# Patient Record
Sex: Female | Born: 1975 | Race: White | Hispanic: No | Marital: Married | Smoking: Never smoker
Health system: Southern US, Community
[De-identification: ages and names within clinical notes are randomized; demographics above are authoritative.]

## PROBLEM LIST (undated history)

## (undated) DIAGNOSIS — G43909 Migraine, unspecified, not intractable, without status migrainosus: Secondary | ICD-10-CM

## (undated) DIAGNOSIS — T4145XA Adverse effect of unspecified anesthetic, initial encounter: Secondary | ICD-10-CM

## (undated) DIAGNOSIS — D649 Anemia, unspecified: Secondary | ICD-10-CM

## (undated) DIAGNOSIS — R011 Cardiac murmur, unspecified: Secondary | ICD-10-CM

## (undated) DIAGNOSIS — F419 Anxiety disorder, unspecified: Secondary | ICD-10-CM

## (undated) DIAGNOSIS — T8859XA Other complications of anesthesia, initial encounter: Secondary | ICD-10-CM

## (undated) DIAGNOSIS — R112 Nausea with vomiting, unspecified: Secondary | ICD-10-CM

## (undated) DIAGNOSIS — Z9889 Other specified postprocedural states: Secondary | ICD-10-CM

## (undated) DIAGNOSIS — K219 Gastro-esophageal reflux disease without esophagitis: Secondary | ICD-10-CM

## (undated) HISTORY — PX: ABDOMINAL HYSTERECTOMY: SHX81

## (undated) HISTORY — DX: Migraine, unspecified, not intractable, without status migrainosus: G43.909

## (undated) HISTORY — PX: BREAST ENHANCEMENT SURGERY: SHX7

## (undated) HISTORY — PX: WISDOM TOOTH EXTRACTION: SHX21

---

## 2004-01-02 HISTORY — PX: LASIK: SHX215

## 2006-01-01 HISTORY — PX: AUGMENTATION MAMMAPLASTY: SUR837

## 2006-01-01 HISTORY — PX: BREAST REDUCTION SURGERY: SHX8

## 2006-09-04 ENCOUNTER — Ambulatory Visit: Payer: Self-pay | Admitting: Plastic Surgery

## 2009-10-20 ENCOUNTER — Ambulatory Visit: Payer: Self-pay | Admitting: Neurology

## 2012-12-10 ENCOUNTER — Emergency Department: Payer: Self-pay | Admitting: Internal Medicine

## 2012-12-10 LAB — COMPREHENSIVE METABOLIC PANEL
Alkaline Phosphatase: 58 U/L
BUN: 13 mg/dL (ref 7–18)
Calcium, Total: 9 mg/dL (ref 8.5–10.1)
Chloride: 107 mmol/L (ref 98–107)
Co2: 25 mmol/L (ref 21–32)
EGFR (African American): >60
EGFR (Non-African Amer.): >60
Glucose: 138 mg/dL — ABNORMAL HIGH (ref 65–99)
Osmolality: 278 (ref 275–301)
Potassium: 3.8 mmol/L (ref 3.5–5.1)
Sodium: 138 mmol/L (ref 136–145)

## 2012-12-10 LAB — CBC
HCT: 41.4 % (ref 35.0–47.0)
HGB: 14.2 g/dL (ref 12.0–16.0)
MCH: 30.6 pg (ref 26.0–34.0)
RBC: 4.63 10*6/uL (ref 3.80–5.20)
RDW: 12.9 % (ref 11.5–14.5)

## 2012-12-10 LAB — CK TOTAL AND CKMB (NOT AT ARMC): CK-MB: <0.5 ng/mL — ABNORMAL LOW (ref 0.5–3.6)

## 2012-12-10 LAB — URINALYSIS, COMPLETE
Bilirubin,UR: NEGATIVE
Blood: NEGATIVE
Glucose,UR: NEGATIVE mg/dL (ref 0–75)
Ketone: NEGATIVE
Nitrite: NEGATIVE
Ph: 6 (ref 4.5–8.0)
Protein: NEGATIVE

## 2012-12-10 LAB — DRUG SCREEN, URINE
Amphetamines, Ur Screen: NEGATIVE (ref ?–1000)
Cannabinoid 50 Ng, Ur ~~LOC~~: NEGATIVE (ref ?–50)
Cocaine Metabolite,Ur ~~LOC~~: NEGATIVE (ref ?–300)
Methadone, Ur Screen: NEGATIVE (ref ?–300)

## 2013-10-13 ENCOUNTER — Encounter: Payer: Self-pay | Admitting: *Deleted

## 2013-10-21 ENCOUNTER — Ambulatory Visit (INDEPENDENT_AMBULATORY_CARE_PROVIDER_SITE_OTHER): Payer: 59 | Admitting: General Surgery

## 2013-10-21 ENCOUNTER — Encounter: Payer: Self-pay | Admitting: General Surgery

## 2013-10-21 ENCOUNTER — Other Ambulatory Visit: Payer: 59

## 2013-10-21 VITALS — BP 134/78 | HR 82 | Resp 12 | Ht 65.0 in | Wt 155.0 lb

## 2013-10-21 DIAGNOSIS — D242 Benign neoplasm of left breast: Secondary | ICD-10-CM

## 2013-10-21 NOTE — Progress Notes (Signed)
Patient ID: Judy Sims, female   DOB: 05/31/1975, 38 y.o.   MRN: [id number]  Chief Complaint  Patient presents with  . Other    breast check    HPI Judy Sims is a 38 y.o. female. here today for a breast evaluation. She states back in May 2015 she went to the doctor and they said it was some yeast , they give her some cream and it got better. Last week she saw Dr. Laurey Morale  Patient states there is a patch of dry skin over her left nipple and areola. .She  noticed this on 10/13/13. She states the under her left breast it is very itchy.    HPI  Past Medical History  Diagnosis Date  . Migraine     Past Surgical History  Procedure Laterality Date  . Breast reduction surgery  2008  . Breast enhancement surgery Left 2008    Family History  Problem Relation Age of Onset  . Colon cancer Maternal Grandfather     Social History History  Substance Use Topics  . Smoking status: Never Smoker   . Smokeless tobacco: Never Used  . Alcohol Use: Yes    Allergies  Allergen Reactions  . Sulpho-Lac Medicated Soap [Sulfur] Hives    Current Outpatient Prescriptions  Medication Sig Dispense Refill  . Multiple Vitamin (MULTI VITAMIN DAILY) TABS Take 1 tablet by mouth daily.       No current facility-administered medications for this visit.    Review of Systems Review of Systems  Constitutional: Negative.   Respiratory: Negative.   Cardiovascular: Negative.     Blood pressure 134/78, pulse 82, resp. rate 12, height 5\' 5"  (1.651 m), weight 155 lb (70.308 kg), last menstrual period 10/02/2013.  Physical Exam Physical Exam  Constitutional: She is oriented to person, place, and time. She appears well-developed and well-nourished.  Eyes: Conjunctivae are normal. No scleral icterus.  Neck: Neck supple. No thyromegaly present.  Cardiovascular: Normal rate, regular rhythm and normal heart sounds.   Pulmonary/Chest: Effort normal and breath sounds normal. Right breast exhibits no  inverted nipple, no mass, no nipple discharge, no skin change and no tenderness. Left breast exhibits no inverted nipple, no mass, no nipple discharge, no skin change and no tenderness.  Mild scaling of the left nipple but no skin lesion or discharge present.  Bilateral implants are present, no apprent deformity./   Lymphadenopathy:    She has no cervical adenopathy.    She has no axillary adenopathy.  Neurological: She is alert and oriented to person, place, and time.  Skin: Skin is warm and dry.    Data Reviewed Notes reviewed [place] of left nipple area was performed-no abnormality seen. Assessment    Mild peeling of skin left nipple-no skin break , scab or lesions noted.    Plan           Judy Sims 10/21/2013, 1:57 PM

## 2013-10-21 NOTE — Patient Instructions (Addendum)
Patient to return in three months. Call if she notes any discharge, new skin changes.

## 2013-11-03 ENCOUNTER — Encounter: Payer: Self-pay | Admitting: General Surgery

## 2014-01-27 ENCOUNTER — Ambulatory Visit: Payer: 59

## 2014-01-27 ENCOUNTER — Encounter: Payer: Self-pay | Admitting: General Surgery

## 2014-01-27 ENCOUNTER — Ambulatory Visit (INDEPENDENT_AMBULATORY_CARE_PROVIDER_SITE_OTHER): Payer: 59 | Admitting: General Surgery

## 2014-01-27 VITALS — BP 112/74 | HR 72 | Resp 14 | Ht 65.0 in | Wt 156.0 lb

## 2014-01-27 DIAGNOSIS — N6320 Unspecified lump in the left breast, unspecified quadrant: Secondary | ICD-10-CM

## 2014-01-27 DIAGNOSIS — N6489 Other specified disorders of breast: Secondary | ICD-10-CM

## 2014-01-27 DIAGNOSIS — N63 Unspecified lump in breast: Secondary | ICD-10-CM

## 2014-01-27 NOTE — Progress Notes (Signed)
Patient ID: Judy Sims, female   DOB: 07-02-1975, 39 y.o.   MRN: [id number]  Chief Complaint  Patient presents with  . Breast Problem    left nipple changes    HPI Judy Sims is a 39 y.o. female.  Here today for evaluation of left nipple changes.  She was here in October with crusting of the left nipple. She has been using coconut oil and lotion and it seems to help some. Check possible mass under left nipple.   HPI  Past Medical History  Diagnosis Date  . Migraine     Past Surgical History  Procedure Laterality Date  . Breast reduction surgery  2008  . Breast enhancement surgery Left 2008    Family History  Problem Relation Age of Onset  . Colon cancer Maternal Grandfather     Social History History  Substance Use Topics  . Smoking status: Never Smoker   . Smokeless tobacco: Never Used  . Alcohol Use: Yes    Allergies  Allergen Reactions  . Sulpho-Lac Medicated Soap [Sulfur] Hives    Current Outpatient Prescriptions  Medication Sig Dispense Refill  . Multiple Vitamin (MULTI VITAMIN DAILY) TABS Take 1 tablet by mouth daily.     No current facility-administered medications for this visit.    Review of Systems Review of Systems  Constitutional: Negative.   Respiratory: Negative.   Cardiovascular: Negative.     Blood pressure 112/74, pulse 72, resp. rate 14, height 5\' 5"  (1.651 m), weight 156 lb (70.761 kg), last menstrual period 01/26/2014.  Physical Exam Physical Exam  Constitutional: She is oriented to person, place, and time. She appears well-developed and well-nourished.  Pulmonary/Chest: Right breast exhibits no inverted nipple, no mass, no nipple discharge, no skin change and no tenderness. Left breast exhibits skin change. Left breast exhibits no inverted nipple, no mass, no nipple discharge and no tenderness.  Minimal scaling around left nipple, no open areas, no scabs or discharge. Bilateral implants are present, no apprent deformity.   Lymphadenopathy:    She has no axillary adenopathy.  Neurological: She is alert and oriented to person, place, and time.  Skin: Skin is warm and dry.    Data Reviewed Notes reviewed. [place] of left nipple area was performed again today- no findings.  Assessment    Mild scaliness of skin left nipple-no skin break down, scab or lesions noted. It does appear to be improved.      Plan   Pt reassured. Will follow on as needed basis. Advised to call if she notes any worsening. Follow up as needed. Continue self breast exams. Call office for any new breast issues or concerns.       SANKAR,SEEPLAPUTHUR G 01/27/2014, 2:17 PM

## 2014-01-27 NOTE — Patient Instructions (Signed)
Continue self breast exams. Call office for any new breast issues or concerns. 

## 2014-12-06 ENCOUNTER — Ambulatory Visit (INDEPENDENT_AMBULATORY_CARE_PROVIDER_SITE_OTHER): Payer: 59 | Admitting: Physician Assistant

## 2014-12-06 ENCOUNTER — Encounter: Payer: Self-pay | Admitting: Physician Assistant

## 2014-12-06 VITALS — BP 100/70 | HR 78 | Temp 98.3°F | Resp 16 | Ht 65.0 in | Wt 157.6 lb

## 2014-12-06 DIAGNOSIS — F419 Anxiety disorder, unspecified: Secondary | ICD-10-CM | POA: Diagnosis not present

## 2014-12-06 DIAGNOSIS — G43909 Migraine, unspecified, not intractable, without status migrainosus: Secondary | ICD-10-CM | POA: Insufficient documentation

## 2014-12-06 MED ORDER — ALPRAZOLAM 0.5 MG PO TABS: ORAL_TABLET | ORAL | Status: DC

## 2014-12-06 NOTE — Progress Notes (Signed)
Patient: Judy Sims Female    DOB: 1975/06/14   39 y.o.   MRN: [id number] Visit Date: 12/06/2014  Today's Provider: Mar Daring, PA-C   Chief Complaint  Patient presents with  . Establish Care   Subjective:    Anxiety Presents for initial (Patient is new to the practice and wants to talk about feeling anxious) visit. Onset was 1 to 6 months ago. The problem has been gradually worsening. Symptoms include decreased concentration, excessive worry, insomnia, irritability, nervous/anxious behavior and palpitations (one time only). Patient reports no chest pain, dizziness, feeling of choking, panic or suicidal ideas. Symptoms occur occasionally. The most recent episode lasted 30 minutes (sometimes depends on the situation). The severity of symptoms is moderate, interfering with daily activities and causing significant distress (not everyitme). Exacerbated by: More home with the remodeling. The quality of sleep is fair. Nighttime awakenings: several.   Treatments tried: Patient was prescribed Lexapro but did not take  the medicine.  She had been previously seen at [place] for her annual physical where she discuss this with her provider there. That was where she was placed on Lexapro. She states she did not want to take it as she does not feel she needs an everyday medication. She feels she just needs something as needed. 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 She states she has dealt with this all her life and has normally done well with controlling symptoms but with the issues going on with their house and all the remodeling going on this has made it more difficult for her to find time to relax and to unwind. Not having this relaxation time has increased her anxiety and irritability. She also has decreased sleep. She states all of this started since October. She  states that when she lies down she has racing thoughts and worrying thoughts that keep her awake and making it difficult for her to fall asleep.     Allergies  Allergen Reactions  . Sulpho-Lac Medicated Soap [Sulfur] Hives   Previous Medications   MULTIPLE VITAMIN (MULTI VITAMIN DAILY) TABS    Take 1 tablet by mouth daily.    Review of Systems  Constitutional: Positive for irritability and fatigue.  Eyes: Negative.   Respiratory: Negative.   Cardiovascular: Positive for palpitations (one time only). Negative for chest pain and leg swelling.  Gastrointestinal: Negative.   Endocrine: Negative.   Genitourinary: Negative.   Musculoskeletal: Negative.   Skin: Negative.   Allergic/Immunologic: Negative.   Neurological: Positive for headaches. Negative for dizziness, weakness, light-headedness and numbness.  Hematological: Negative.   Psychiatric/Behavioral: Positive for sleep disturbance, decreased concentration and agitation. Negative for suicidal ideas, self-injury and dysphoric mood. The patient is nervous/anxious and has insomnia. The patient is not hyperactive.     Social History  Substance Use Topics  . Smoking status: Never Smoker   . Smokeless tobacco: Never Used  . Alcohol Use: Yes   Objective:   BP 100/70 mmHg  Pulse 78  Temp(Src) 98.3 F (36.8 C) (Oral)  Resp 16  Ht 5\' 5"  (1.651 m)  Wt 157 lb 9.6 oz (71.487 kg)  BMI 26.23 kg/m2  LMP 11/09/2014  Physical Exam  Constitutional: She is oriented to person, place, and time. She appears well-developed and well-nourished. No distress.  Cardiovascular: Normal rate, regular rhythm and normal heart  sounds.  Exam reveals no gallop and no friction rub.   No murmur heard. Pulmonary/Chest: Effort normal and breath sounds normal. No respiratory distress. She has no wheezes. She has no rales.  Neurological: She is alert and oriented to person, place, and time.  Skin: She is not diaphoretic.  Psychiatric: Her speech is normal  and behavior is normal. Judgment and thought content normal. Her mood appears anxious. Cognition and memory are normal.  Vitals reviewed.       Assessment & Plan:     1. Acute anxiety Increasing and worsening anxiety since October 2016. I will treat with Xanax as below. I also advised her to increase physical activity as this will help with anxiety as well as sleep. We also discussed sleep hygiene and sleep meditation techniques. She is to try the sleeping techniques as well as a half a tablet of Xanax nightly as needed to see if this helps her sleep. She may also take another half a tablet to one full tablet every 8 hours as needed during the day for anxiety attacks. I will follow-up with her in 4 weeks to see how she is doing with this treatment plan. If she is taking an increased dose of the Xanax daily we will add an antidepressant to see if it will help decrease anxiety. We will also check labs including TSH if she is not improving. She is to call the office if symptoms fail to improve or worsen in the meantime. - ALPRAZolam (XANAX) 0.5 MG tablet; Take 1/2 to 1 tablet PO q h.s. And then may take 1/2 to 1 tab PO q 8 hrs prn anxiety  Dispense: 45 tablet; Refill: [place], PA-C  [facility]

## 2014-12-06 NOTE — Patient Instructions (Addendum)
Generalized Anxiety Disorder Generalized anxiety disorder (GAD) is a mental disorder. It interferes with life functions, including relationships, work, and school. GAD is different from normal anxiety, which everyone experiences at some point in their lives in response to specific life events and activities. Normal anxiety actually helps [place] prepare for and get through these life events and activities. Normal anxiety goes away after the event or activity is over.  GAD causes anxiety that is not necessarily related to specific events or activities. It also causes excess anxiety in proportion to specific events or activities. The anxiety associated with GAD is also difficult to control. GAD can vary from mild to severe. People with severe GAD can have intense waves of anxiety with physical symptoms (panic attacks).  SYMPTOMS The anxiety and worry associated with GAD are difficult to control. This anxiety and worry are related to many life events and activities and also occur more days than not for 6 months or longer. People with GAD also have three or more of the following symptoms (one or more in children):  Restlessness.   Fatigue.  Difficulty concentrating.   Irritability.  Muscle tension.  Difficulty sleeping or unsatisfying sleep. DIAGNOSIS GAD is diagnosed through an assessment by your health care provider. Your health care provider will ask you questions aboutyour mood,physical symptoms, and events in your life. Your health care provider may ask you about your medical history and use of alcohol or drugs, including prescription medicines. Your health care provider may also do a physical exam and blood tests. Certain medical conditions and the use of certain substances can cause symptoms similar to those associated with GAD. Your health care provider may refer you to a mental health specialist for further evaluation. TREATMENT The following therapies are usually used to treat GAD:    Medication. Antidepressant medication usually is prescribed for long-term daily control. Antianxiety medicines may be added in severe cases, especially when panic attacks occur.   Talk therapy (psychotherapy). Certain types of talk therapy can be helpful in treating GAD by providing support, education, and guidance. A form of talk therapy called cognitive behavioral therapy can teach you healthy ways to think about and react to daily life events and activities.  Stress managementtechniques. These include yoga, meditation, and exercise and can be very helpful when they are practiced regularly. A mental health specialist can help determine which treatment is best for you. Some people see improvement with one therapy. However, other people require a combination of therapies.   This information is not intended to replace advice given to you by your health care provider. Make sure you discuss any questions you have with your health care provider.   Document Released: 04/14/2012 Document Revised: 01/08/2014 Document Reviewed: 04/14/2012 Elsevier Interactive Patient Education 2016 Elsevier Inc.  Alprazolam orally disintegrating tablet What is this medicine? ALPRAZOLAM (al PRAY zoe lam) is a benzodiazepine. It is used to treat anxiety and panic attacks. This medicine may be used for other purposes; ask your health care provider or pharmacist if you have questions. What should I tell my health care provider before I take this medicine? They need to know if you have any of these conditions: -an alcohol or drug abuse problem -bipolar disorder, depression, psychosis or other mental health conditions -glaucoma -kidney or liver disease -lung or breathing disease -myasthenia gravis -Parkinson's disease -porphyria -seizures or a history of seizures -suicidal thoughts -an unusual or allergic reaction to alprazolam, other benzodiazepines, foods, dyes, or preservatives -pregnant or trying to get  pregnant -breast-feeding How should I use this medicine? These tablets are made to dissolve in the mouth. Just before taking the tablet, remove the tablet from the bottle with dry hands. Place the tablet on the top of the tongue and allow it to dissolve, then swallow. You may take these tablets with water, but it is not necessary to do so. If only one-half of the tablet is used, the unused portion of the tablet should be safely discarded because it may not remain stable. Do not reuse this portion of the tablet. Discard unused tablets in a safe manner away from children and pets. Talk to your pediatrician regarding the use of this medicine in children. Special care may be needed. Overdosage: If you think you have taken too much of this medicine contact a poison control center or emergency room at once. NOTE: This medicine is only for you. Do not share this medicine with others. What if I miss a dose? If you miss a dose, take it as soon as you can. If it is almost time for your next dose, take only that dose. Do not take double or extra doses. What may interact with this medicine? Do not take this medicine with any of the following medications: -ketoconazole -itraconazole This medicine may also interact with the following medications: -cimetidine -cyclosporine -ergotamine -female hormones, including contraceptive or birth control pills -grapefruit juice -herbal or dietary supplements like kava kava, melatonin, dehydroepiandrosterone, DHEA, St. John's Wort or valerian -imatinib, STI-571 -isoniazid -levodopa -medicines for depression, mental problems or psychiatric disturbances -prescription pain medicines -rifampin, rifapentine, or rifabutin -some antibiotics like clarithromycin, erythromycin, troleandomycin -some medicines for high blood pressure or heart problems -some medicines for HIV infection or AIDS -some medicines for seizures like carbamazepine, oxcarbazepine, phenobarbital,  phenytoin, primidone This list may not describe all possible interactions. Give your health care provider a list of all the medicines, herbs, non-prescription drugs, or dietary supplements you use. Also tell them if you smoke, drink alcohol, or use illegal drugs. Some items may interact with your medicine. What should I watch for while using this medicine? Visit your doctor or health care professional for regular checks on your progress. Your body can become dependent on this medicine. Ask your doctor or health care professional if you still need to take it. If you have been taking this medicine regularly for some time, do not suddenly stop taking it. You must gradually reduce the dose or you may get severe side effects. Ask your doctor or health care professional for advice. Even after you stop taking this medicine it can still affect your body for several days. You may get drowsy or dizzy. Do not drive, use machinery, or do anything that needs mental alertness until you know how this medicine affects you. To reduce the risk of dizzy and fainting spells, do not stand or sit up quickly, especially if you are an older patient. Alcohol may increase dizziness and drowsiness. Avoid alcoholic drinks. Do not treat yourself for coughs, colds or allergies without asking your doctor or health care professional for advice. Some ingredients can increase possible side effects. What side effects may I notice from receiving this medicine? Side effects that you should report to your doctor or health care professional as soon as possible: -allergic reactions like skin rash, itching or hives, swelling of the face, lips, or tongue -confusion, forgetfulness -depression -difficulty sleeping -difficulty speaking -feeling faint or lightheaded, falls -mood changes, excitability or aggressive behavior -muscle cramps -trouble passing urine or  change in the amount of urine -unusually weak or tired Side effects that usually  do not require medical attention (report to your doctor or health care professional if they continue or are bothersome): -changes in appetite -change in sex drive or performance This list may not describe all possible side effects. Call your doctor for medical advice about side effects. You may report side effects to FDA at 1-800-FDA-1088. Where should I keep my medicine? Keep out of the reach of children. This medicine can be abused. Keep your medicine in a safe place to protect it from theft. Do not share this medicine with anyone. Selling or giving away this medicine is dangerous and against the law. Store at room temperature between 15 and 30 degrees C (59 and 86 degrees F). This medicine may cause accidental overdose and death if taken by other adults, children, or pets. Mix any unused medicine with a substance like cat litter or coffee grounds. Then throw the medicine away in a sealed container like a sealed bag or a coffee can with a lid. Do not use the medicine after the expiration date. NOTE: This sheet is a summary. It may not cover all possible information. If you have questions about this medicine, talk to your doctor, pharmacist, or health care provider.    2016, Elsevier/Gold Standard. (2013-09-08 14:51:07)

## 2014-12-06 NOTE — Progress Notes (Deleted)
       Patient: Judy Sims, Female    DOB: 1975-01-28, 39 y.o.   MRN: [id number] Visit Date: 12/06/2014  Today's Provider: Mar Daring, PA-C   Chief Complaint  Patient presents with  . Establish Care   Subjective:    Annual physical exam Judy Sims is a 39 y.o. female who presents today for health maintenance and complete physical. She feels {DESC; WELL/FAIRLY WELL/POORLY:18703}. She reports exercising ***. She reports she is sleeping {DESC; WELL/FAIRLY WELL/POORLY:18703}.  -----------------------------------------------------------------   Review of Systems  Constitutional: Negative.   HENT: Negative.   Eyes: Negative.   Respiratory: Negative.   Cardiovascular: Negative.   Gastrointestinal: Negative.   Endocrine: Negative.   Genitourinary: Negative.   Musculoskeletal: Negative.   Skin: Negative.   Allergic/Immunologic: Negative.   Neurological: Negative.   Hematological: Negative.   Psychiatric/Behavioral: Negative.     Social History      She  reports that she has never smoked. She has never used smokeless tobacco. She reports that she drinks alcohol. She reports that she does not use illicit drugs.       Social History   Social History  . Marital Status: Married    Spouse Name: N/A  . Number of Children: N/A  . Years of Education: N/A   Social History Main Topics  . Smoking status: Never Smoker   . Smokeless tobacco: Never Used  . Alcohol Use: Yes  . Drug Use: No  . Sexual Activity: Not on file   Other Topics Concern  . Not on file   Social History Narrative    There are no active problems to display for this patient.   Past Surgical History  Procedure Laterality Date  . Breast reduction surgery  2008  . Breast enhancement surgery Left 2008    Family History        No family status information on file.        Her family history includes Colon cancer in her maternal grandfather.    Allergies  Allergen Reactions  .  Sulpho-Lac Medicated Soap [Sulfur] Hives    Previous Medications   MULTIPLE VITAMIN (MULTI VITAMIN DAILY) TABS    Take 1 tablet by mouth daily.    Patient Care Team: Mar Daring, PA-C as PCP - General (Family Medicine) Rosina Lowenstein, MD (Unknown Physician Specialty) Christene Lye, MD (General Surgery)     Objective:   Vitals: There were no vitals taken for this visit.   Physical Exam   Depression Screen No flowsheet data found.    Assessment & Plan:     Routine Health Maintenance and Physical Exam  Exercise Activities and Dietary recommendations Goals    None       There is no immunization history on file for this patient.  Health Maintenance  Topic Date Due  . HIV Screening  04/20/1990  . TETANUS/TDAP  04/20/1994  . PAP SMEAR  04/19/1996  . INFLUENZA VACCINE  08/02/2014      Discussed health benefits of physical activity, and encouraged her to engage in regular exercise appropriate for her age and condition.    --------------------------------------------------------------------

## 2015-01-04 ENCOUNTER — Encounter: Payer: Self-pay | Admitting: Physician Assistant

## 2015-01-04 ENCOUNTER — Ambulatory Visit (INDEPENDENT_AMBULATORY_CARE_PROVIDER_SITE_OTHER): Payer: 59 | Admitting: Physician Assistant

## 2015-01-04 VITALS — BP 100/60 | HR 82 | Temp 98.9°F | Resp 16 | Wt 161.6 lb

## 2015-01-04 DIAGNOSIS — F419 Anxiety disorder, unspecified: Secondary | ICD-10-CM | POA: Diagnosis not present

## 2015-01-04 MED ORDER — ALPRAZOLAM 0.5 MG PO TABS
ORAL_TABLET | ORAL | Status: DC
Start: 2015-01-04 — End: 2015-09-14

## 2015-01-04 NOTE — Progress Notes (Signed)
       Patient: Judy Sims Female    DOB: 11-09-1975   40 y.o.   MRN: [id number] Visit Date: 01/04/2015  Today's Provider: Mar Daring, PA-C   Chief Complaint  Patient presents with  . Follow-up    Anxiety   Subjective:    Anxiety Presents for follow-up visit. The problem has been gradually improving (patient is able to sleep at night). Symptoms include excessive worry, nervous/anxious behavior and restlessness. Patient reports no chest pain, depressed mood, dizziness, feeling of choking or panic. The severity of symptoms is mild. The symptoms are aggravated by family issues. The quality of sleep is fair. Nighttime awakenings: occasional.   Treatments tried: Patient is currently on Xanax. Patient states is helping  The treatment provided mild relief. Compliance with prior treatments has been good.        Allergies  Allergen Reactions  . Sulpho-Lac Medicated Soap [Sulfur] Hives   Previous Medications   ALPRAZOLAM (XANAX) 0.5 MG TABLET    Take 1/2 to 1 tablet PO q h.s. And then may take 1/2 to 1 tab PO q 8 hrs prn anxiety   DAPSONE (ACZONE EX)    Apply topically daily.   MULTIPLE VITAMIN (MULTI VITAMIN DAILY) TABS    Take 1 tablet by mouth daily.    Review of Systems  Constitutional: Positive for fatigue.  Respiratory: Negative.   Cardiovascular: Negative.  Negative for chest pain.  Gastrointestinal: Negative.   Neurological: Negative.  Negative for dizziness.  Psychiatric/Behavioral: The patient is nervous/anxious.   All other systems reviewed and are negative.   Social History  Substance Use Topics  . Smoking status: Never Smoker   . Smokeless tobacco: Never Used  . Alcohol Use: Yes     Comment: socially   Objective:   BP 100/60 mmHg  Pulse 82  Temp(Src) 98.9 F (37.2 C) (Oral)  Resp 16  Wt 161 lb 9.6 oz (73.301 kg)  LMP 12/06/2014  Physical Exam  Constitutional: She appears well-developed and well-nourished. No distress.  Neck: Normal range of  motion. Neck supple. No tracheal deviation present. No thyromegaly present.  Cardiovascular: Normal rate, regular rhythm and normal heart sounds.  Exam reveals no gallop and no friction rub.   No murmur heard. Pulmonary/Chest: Effort normal and breath sounds normal. No respiratory distress. She has no wheezes. She has no rales.  Lymphadenopathy:    She has no cervical adenopathy.  Skin: She is not diaphoretic.  Vitals reviewed.       Assessment & Plan:     1. Acute anxiety Symptoms have been well controlled with current Xanax dose of 0.5 mg. I will refill her medication as below. She is planning to start a regular exercise routine with some coworkers. I do believe this will help with her stress and anxiety level as well. He also mentions that her home renovations including her master bathroom are almost completely she is hoping for them to be done in the next couple of weeks. This will also help to reduce a lot of her stress. She is to call the office if symptoms fail to improve or worsen. - ALPRAZolam (XANAX) 0.5 MG tablet; Take 1/2 to 1 tablet PO q h.s. And then may take 1/2 to 1 tab PO q 8 hrs prn anxiety  Dispense: 45 tablet; Refill: [place], PA-C  [facility]

## 2015-04-07 ENCOUNTER — Encounter: Payer: Self-pay | Admitting: Physician Assistant

## 2015-04-07 ENCOUNTER — Ambulatory Visit (INDEPENDENT_AMBULATORY_CARE_PROVIDER_SITE_OTHER): Payer: 59 | Admitting: Physician Assistant

## 2015-04-07 VITALS — BP 100/60 | HR 78 | Temp 98.5°F | Resp 16 | Wt 154.2 lb

## 2015-04-07 DIAGNOSIS — J0110 Acute frontal sinusitis, unspecified: Secondary | ICD-10-CM

## 2015-04-07 MED ORDER — AMOXICILLIN-POT CLAVULANATE 875-125 MG PO TABS: 1.0000 | ORAL_TABLET | Freq: Two times a day (BID) | ORAL | Status: DC

## 2015-04-07 NOTE — Patient Instructions (Signed)

## 2015-04-07 NOTE — Progress Notes (Signed)
Patient: Judy Sims Female    DOB: 23-May-1975   40 y.o.   MRN: [id number] Visit Date: 04/07/2015  Today's Provider: Mar Daring, PA-C   Chief Complaint  Patient presents with  . Generalized Body Aches   Subjective:    HPI Patient is here today with c/o feeling tired and body aches this started yesterday. She has a little cough, sinus pressure and headache. No fever,SOB, Wheezing, chest tightness,chest pain, leg swelling or palpitations. Patient was treated with Amoxicillin 2 weeks ago for ear pain through the Call nurse with similar symptoms. She does have seasonal allergies and is taking zyrtec-d.     Allergies  Allergen Reactions  . Sulpho-Lac Medicated Soap [Sulfur] Hives   Previous Medications   ALPRAZOLAM (XANAX) 0.5 MG TABLET    Take 1/2 to 1 tablet PO q h.s. And then may take 1/2 to 1 tab PO q 8 hrs prn anxiety   DAPSONE (ACZONE EX)    Apply topically daily.   MULTIPLE VITAMIN (MULTI VITAMIN DAILY) TABS    Take 1 tablet by mouth daily.    Review of Systems  Constitutional: Positive for fatigue.  HENT: Positive for sneezing (not more than usual). Negative for postnasal drip.   Respiratory: Positive for cough. Negative for chest tightness, shortness of breath and wheezing.   Cardiovascular: Negative for chest pain, palpitations and leg swelling.  Gastrointestinal: Negative for nausea, vomiting and abdominal pain.  Neurological: Positive for headaches. Negative for dizziness.    Social History  Substance Use Topics  . Smoking status: Never Smoker   . Smokeless tobacco: Never Used  . Alcohol Use: Yes     Comment: socially   Objective:   BP 100/60 mmHg  Pulse 78  Temp(Src) 98.5 F (36.9 C) (Oral)  Resp 16  Wt 154 lb 3.2 oz (69.945 kg)  LMP 03/29/2015  Physical Exam  Constitutional: She appears well-developed and well-nourished. No distress.  HENT:  Head: Normocephalic and atraumatic.  Right Ear: Hearing, tympanic membrane, external ear and  ear canal normal.  Left Ear: Hearing, tympanic membrane, external ear and ear canal normal.  Nose: Right sinus exhibits frontal sinus tenderness. Right sinus exhibits no maxillary sinus tenderness. Left sinus exhibits frontal sinus tenderness. Left sinus exhibits no maxillary sinus tenderness.  Mouth/Throat: Uvula is midline, oropharynx is clear and moist and mucous membranes are normal. No oropharyngeal exudate, posterior oropharyngeal edema or posterior oropharyngeal erythema.  Neck: Normal range of motion. Neck supple. No tracheal deviation present. No thyromegaly present.  Cardiovascular: Normal rate, regular rhythm and normal heart sounds.  Exam reveals no gallop and no friction rub.   No murmur heard. Pulmonary/Chest: Effort normal and breath sounds normal. No stridor. No respiratory distress. She has no wheezes. She has no rales.  Lymphadenopathy:    She has no cervical adenopathy.  Skin: She is not diaphoretic.  Vitals reviewed.       Assessment & Plan:     1. Acute frontal sinusitis, recurrence not specified Worsening symptoms that has not responded to OTC medications. Will treat with augmentin as below. She is to continue her zyrtec-D. She needs to stay well hydrated and get plenty of rest. She may take Mucinex DM or Delsym for cough. She is to call if symptoms do not improve or worsen. - amoxicillin-clavulanate (AUGMENTIN) 875-125 MG tablet; Take 1 tablet by mouth 2 (two) times daily.  Dispense: 14 tablet; Refill: 0       Clearnce Sorrel  Marlyn Corporal, PA-C  [facility]

## 2015-08-22 ENCOUNTER — Other Ambulatory Visit: Payer: Self-pay | Admitting: Certified Nurse Midwife

## 2015-08-22 DIAGNOSIS — Z1231 Encounter for screening mammogram for malignant neoplasm of breast: Secondary | ICD-10-CM

## 2015-09-14 ENCOUNTER — Other Ambulatory Visit: Payer: Self-pay | Admitting: Physician Assistant

## 2015-09-14 DIAGNOSIS — F419 Anxiety disorder, unspecified: Secondary | ICD-10-CM

## 2015-09-15 NOTE — Telephone Encounter (Signed)
RX called in-aa 

## 2015-10-18 ENCOUNTER — Encounter
Admission: RE | Admit: 2015-10-18 | Discharge: 2015-10-18 | Disposition: A | Discharge status: Home / Self Care | Payer: 59 | Source: Ambulatory Visit | Attending: Obstetrics and Gynecology | Admitting: Obstetrics and Gynecology

## 2015-10-18 HISTORY — DX: Other specified postprocedural states: Z98.890

## 2015-10-18 HISTORY — DX: Anemia, unspecified: D64.9

## 2015-10-18 HISTORY — DX: Nausea with vomiting, unspecified: R11.2

## 2015-10-18 HISTORY — DX: Anxiety disorder, unspecified: F41.9

## 2015-10-18 HISTORY — DX: Other complications of anesthesia, initial encounter: T88.59XA

## 2015-10-18 HISTORY — DX: Cardiac murmur, unspecified: R01.1

## 2015-10-18 HISTORY — DX: Gastro-esophageal reflux disease without esophagitis: K21.9

## 2015-10-18 HISTORY — DX: Adverse effect of unspecified anesthetic, initial encounter: T41.45XA

## 2015-10-18 NOTE — Patient Instructions (Signed)
  Your procedure is scheduled on: 10-20-15 Report to Same Day Surgery 2nd floor medical mall To find out your arrival time please call [phone] between 1PM - 3PM on 10-19-15  Remember: Instructions that are not followed completely may result in serious medical risk, up to and including death, or upon the discretion of your surgeon and anesthesiologist your surgery may need to be rescheduled.    _x___ 1. Do not eat food or drink liquids after midnight. No gum chewing or hard candies.     __x__ 2. No Alcohol for 24 hours before or after surgery.   __x__3. No Smoking for 24 prior to surgery.   ____  4. Bring all medications with you on the day of surgery if instructed.    __x__ 5. Notify your doctor if there is any change in your medical condition     (cold, fever, infections).     Do not wear jewelry, make-up, hairpins, clips or nail polish.  Do not wear lotions, powders, or perfumes. You may wear deodorant.  Do not shave 48 hours prior to surgery. Men may shave face and neck.  Do not bring valuables to the hospital.    [facility] is not responsible for any belongings or valuables.               Contacts, dentures or bridgework may not be worn into surgery.  Leave your suitcase in the car. After surgery it may be brought to your room.  For patients admitted to the hospital, discharge time is determined by your treatment team.   Patients discharged the day of surgery will not be allowed to drive home.    Please read over the following fact sheets that you were given:   [facility] Preparing for Surgery and or MRSA Information   _x___ Take these medicines the morning of surgery with A SIP OF WATER:    1. PRILOSEC  2. TAKE A PRILOSEC ON Wednesday NIGHT BEFORE BED  3. MAY TAKE XANAX IF NEEDED DAY OF SURGERY  4.  5.  6.  ____Fleets enema or Magnesium Citrate as directed.   ____ Use CHG Soap or sage wipes as directed on instruction sheet   ____ Use inhalers on the day of  surgery and bring to hospital day of surgery  ____ Stop metformin 2 days prior to surgery    ____ Take 1/2 of usual insulin dose the night before surgery and none on the morning of   surgery.   ____ Stop aspirin or coumadin, or plavix  x__ Stop Anti-inflammatories such as Advil, Aleve, Ibuprofen, Motrin, Naproxen,          Naprosyn, Goodies powders or aspirin products. Ok to take Tylenol.   ____ Stop supplements until after surgery.    ____ Bring C-Pap to the hospital.

## 2015-10-20 ENCOUNTER — Ambulatory Visit: Payer: 59 | Admitting: Anesthesiology

## 2015-10-20 ENCOUNTER — Encounter: Payer: Self-pay | Admitting: *Deleted

## 2015-10-20 ENCOUNTER — Ambulatory Visit
Admission: RE | Admit: 2015-10-20 | Discharge: 2015-10-20 | Disposition: A | Discharge status: Home / Self Care | Payer: 59 | Source: Ambulatory Visit | Attending: Obstetrics and Gynecology | Admitting: Obstetrics and Gynecology

## 2015-10-20 ENCOUNTER — Encounter
Admission: RE | Disposition: A | Discharge status: Home / Self Care | Payer: Self-pay | Source: Ambulatory Visit | Attending: Obstetrics and Gynecology

## 2015-10-20 DIAGNOSIS — F419 Anxiety disorder, unspecified: Secondary | ICD-10-CM | POA: Diagnosis not present

## 2015-10-20 DIAGNOSIS — N920 Excessive and frequent menstruation with regular cycle: Secondary | ICD-10-CM | POA: Insufficient documentation

## 2015-10-20 DIAGNOSIS — N946 Dysmenorrhea, unspecified: Secondary | ICD-10-CM | POA: Insufficient documentation

## 2015-10-20 HISTORY — PX: HYSTEROSCOPY WITH D & C: SHX1775

## 2015-10-20 LAB — POCT PREGNANCY, URINE: Preg Test, Ur: NEGATIVE

## 2015-10-20 SURGERY — DILATATION AND CURETTAGE /HYSTEROSCOPY
Anesthesia: General

## 2015-10-20 MED ORDER — DEXAMETHASONE SODIUM PHOSPHATE 10 MG/ML IJ SOLN
INTRAMUSCULAR | Status: DC | PRN
  Administered 2015-10-20: 4 mg via INTRAVENOUS

## 2015-10-20 MED ORDER — LIDOCAINE HCL (CARDIAC) 20 MG/ML IV SOLN
INTRAVENOUS | Status: DC | PRN
  Administered 2015-10-20: 80 mg via INTRAVENOUS

## 2015-10-20 MED ORDER — HYDROCODONE-ACETAMINOPHEN 5-325 MG PO TABS: 1.0000 | ORAL_TABLET | Freq: Four times a day (QID) | ORAL | 0 refills | Status: DC | PRN

## 2015-10-20 MED ORDER — PROPOFOL 10 MG/ML IV BOLUS
INTRAVENOUS | Status: DC | PRN
  Administered 2015-10-20: 150 mg via INTRAVENOUS

## 2015-10-20 MED ORDER — IBUPROFEN 600 MG PO TABS: 600.0000 mg | ORAL_TABLET | Freq: Four times a day (QID) | ORAL | 3 refills | Status: DC | PRN

## 2015-10-20 MED ORDER — LACTATED RINGERS IV SOLN
INTRAVENOUS | Status: DC
  Administered 2015-10-20: 07:00:00 via INTRAVENOUS

## 2015-10-20 MED ORDER — ONDANSETRON HCL 4 MG/2ML IJ SOLN: 4.0000 mg | Freq: Once | INTRAMUSCULAR | Status: DC | PRN

## 2015-10-20 MED ORDER — FENTANYL CITRATE (PF) 100 MCG/2ML IJ SOLN
25.0000 ug | INTRAMUSCULAR | Status: DC | PRN
  Administered 2015-10-20 (×4): 25 ug via INTRAVENOUS

## 2015-10-20 MED ORDER — GLYCOPYRROLATE 0.2 MG/ML IJ SOLN
INTRAMUSCULAR | Status: DC | PRN
  Administered 2015-10-20: 0.2 mg via INTRAVENOUS

## 2015-10-20 MED ORDER — ONDANSETRON HCL 4 MG/2ML IJ SOLN
INTRAMUSCULAR | Status: DC | PRN
  Administered 2015-10-20: 4 mg via INTRAVENOUS

## 2015-10-20 MED ORDER — FENTANYL CITRATE (PF) 100 MCG/2ML IJ SOLN
INTRAMUSCULAR | Status: DC | PRN
  Administered 2015-10-20 (×2): 50 ug via INTRAVENOUS

## 2015-10-20 MED ORDER — FENTANYL CITRATE (PF) 100 MCG/2ML IJ SOLN
INTRAMUSCULAR | Status: AC
  Administered 2015-10-20: 25 ug via INTRAVENOUS
  Filled 2015-10-20: qty 2

## 2015-10-20 SURGICAL SUPPLY — 15 items
CATH ROBINSON RED A/P 16FR (CATHETERS) ×3 IMPLANT
ELECT REM PT RETURN 9FT ADLT (ELECTROSURGICAL) ×3
ELECTRODE REM PT RTRN 9FT ADLT (ELECTROSURGICAL) ×1 IMPLANT
GLOVE BIO SURGEON STRL SZ7 (GLOVE) ×6 IMPLANT
GLOVE INDICATOR 7.5 STRL GRN (GLOVE) ×6 IMPLANT
GOWN STRL REUS W/ TWL LRG LVL3 (GOWN DISPOSABLE) ×2 IMPLANT
GOWN STRL REUS W/TWL LRG LVL3 (GOWN DISPOSABLE) ×4
IV LACTATED RINGERS 1000ML (IV SOLUTION) ×3 IMPLANT
KIT RM TURNOVER CYSTO AR (KITS) ×3 IMPLANT
PACK DNC HYST (MISCELLANEOUS) ×3 IMPLANT
PAD OB MATERNITY 4.3X12.25 (PERSONAL CARE ITEMS) ×3 IMPLANT
PAD PREP 24X41 OB/GYN DISP (PERSONAL CARE ITEMS) ×3 IMPLANT
TOWEL OR 17X26 4PK STRL BLUE (TOWEL DISPOSABLE) ×3 IMPLANT
TUBING CONNECTING 10 (TUBING) ×2 IMPLANT
TUBING CONNECTING 10' (TUBING) ×1

## 2015-10-20 NOTE — Anesthesia Procedure Notes (Signed)
Procedure Name: LMA Insertion Performed by: Jerzie Bieri Pre-anesthesia Checklist: Patient identified, Patient being monitored, Timeout performed, Emergency Drugs available and Suction available Patient Re-evaluated:Patient Re-evaluated prior to inductionOxygen Delivery Method: Circle system utilized Preoxygenation: Pre-oxygenation with 100% oxygen Intubation Type: IV induction LMA: LMA inserted LMA Size: 3.0 Tube type: Oral Number of attempts: 1 Placement Confirmation: positive ETCO2 and breath sounds checked- equal and bilateral Tube secured with: Tape Dental Injury: Teeth and Oropharynx as per pre-operative assessment        

## 2015-10-20 NOTE — Op Note (Signed)
Patient Name: Judy Sims Date of Procedure: 10/20/15   Preoperative Diagnosis: 1) 40 y.o. with menorrhagia and dysmenorrhea  Postoperative Diagnosis: 1) 40 y.o. 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  Given the septum Novasure ablation was unable to be performed.  Sounding length was 7cm, cervical length 4cm.  Otherwise normal findings  Patient Condition: stable  Procedure in Detail:  Patient was taken to the operating room were she was administered general endotracheal anesthesia.  She was positioned in the dorsal lithotomy position utilizing Allen stirups, prepped and draped in the usual sterile fashion.  Uterus was noted to be non-enlarged, anteverted.   Prior to proceeding with the case a time out was performed.  Attention was turned to the patient's pelvis.  A red rubber catheter was used to empty the patient's bladder.  An operative speculum was placed to allow visualization of the cervix.  The anterior lip of the cervix was grasped with a single tooth tenaculum, uterus sounded to 7cm, and the cervix was sequentially dilated using pratt dilators.  The hysteroscope was then advanced into the uterine cavity noting the above findings.  The hysteroscope was removed to the level of the internal cervical os and marked at 4cm for cervical length.  Sharp curettage was performed and the resulting specimen collected and sent to pathology.    The single tooth tenaculum was  removed from the cervix.  The tenaculum sites and cervix were noted to be  Hemostatic before removing the operative speculum.  Sponge needle and instrument counts were corrects times two.  The patient tolerated the procedure well and was taken to the recovery room in stable condition.

## 2015-10-20 NOTE — Anesthesia Preprocedure Evaluation (Signed)
Anesthesia Evaluation  Patient identified by MRN, date of birth, ID band Patient awake    Reviewed: Allergy & Precautions, NPO status , Patient's Chart, lab work & pertinent test results, reviewed documented beta blocker date and time   History of Anesthesia Complications (+) PONV  Airway Mallampati: II  TM Distance: >3 FB     Dental  (+) Chipped   Pulmonary           Cardiovascular      Neuro/Psych  Headaches, Anxiety    GI/Hepatic GERD  Controlled,  Endo/Other    Renal/GU      Musculoskeletal   Abdominal   Peds  Hematology  (+) anemia ,   Anesthesia Other Findings   Reproductive/Obstetrics                             Anesthesia Physical Anesthesia Plan  ASA: II  Anesthesia Plan: General   Post-op Pain Management:    Induction: Intravenous  Airway Management Planned: LMA  Additional Equipment:   Intra-op Plan:   Post-operative Plan:   Informed Consent: I have reviewed the patients History and Physical, chart, labs and discussed the procedure including the risks, benefits and alternatives for the proposed anesthesia with the patient or authorized representative who has indicated his/her understanding and acceptance.     Plan Discussed with: CRNA  Anesthesia Plan Comments:         Anesthesia Quick Evaluation

## 2015-10-20 NOTE — Discharge Instructions (Signed)
AMBULATORY SURGERY  DISCHARGE INSTRUCTIONS   1) The drugs that you were given will stay in your system until tomorrow so for the next 24 hours you should not:  A) Drive an automobile B) Make any legal decisions C) Drink any alcoholic beverage   2) You may resume regular meals tomorrow.  Today it is better to start with liquids and gradually work up to solid foods.  You may eat anything you prefer, but it is better to start with liquids, then soup and crackers, and gradually work up to solid foods.   3) Please notify your doctor immediately if you have any unusual bleeding, trouble breathing, redness and pain at the surgery site, drainage, fever, or pain not relieved by medication.    4) Additional Instructions:  Dilation and Curettage or Vacuum Curettage, Care After Refer to this sheet in the next few weeks. These instructions provide you with information on caring for yourself after your procedure. Your health care provider may also give you more specific instructions. Your treatment has been planned according to current medical practices, but problems sometimes occur. Call your health care provider if you have any problems or questions after your procedure. WHAT TO EXPECT AFTER THE PROCEDURE After your procedure, it is typical to have light cramping and bleeding. This may last for 2 days to 2 weeks after the procedure. HOME CARE INSTRUCTIONS   Do not drive for 24 hours.  Wait 1 week before returning to strenuous activities.  Take your temperature 2 times a day for 4 days and write it down. Provide these temperatures to your health care provider if you develop a fever.  Avoid long periods of standing.  Avoid heavy lifting, pushing, or pulling. Do not lift anything heavier than 10 pounds (4.5 kg).  Limit stair climbing to once or twice a day.  Take rest periods often.  You may resume your usual diet.  Drink enough fluids to keep your urine clear or pale yellow.  Your  usual bowel function should return. If you have constipation, you may:  Take a mild laxative with permission from your health care provider.  Add fruit and bran to your diet.  Drink more fluids.  Take showers instead of baths until your health care provider gives you permission to take baths.  Do not go swimming or use a hot tub until your health care provider approves.  Try to have someone with you or available to you the first 24-48 hours, especially if you were given a general anesthetic.  Do not douche, use tampons, or have sex (intercourse) for 2 weeks after the procedure.  Only take over-the-counter or prescription medicines as directed by your health care provider. Do not take aspirin. It can cause bleeding.  Follow up with your health care provider as directed. SEEK MEDICAL CARE IF:   You have increasing cramps or pain that is not relieved with medicine.  You have abdominal pain that does not seem to be related to the same area of earlier cramping and pain.  You have bad smelling vaginal discharge.  You have a rash.  You are having problems with any medicine. SEEK IMMEDIATE MEDICAL CARE IF:   You have bleeding that is heavier than a normal menstrual period.  You have a fever.  You have chest pain.  You have shortness of breath.  You feel dizzy or feel like fainting.  You pass out.  You have pain in your shoulder strap area.  You have heavy vaginal  bleeding with or without blood clots. MAKE SURE YOU:   Understand these instructions.  Will watch your condition.  Will get help right away if you are not doing well or get worse.   This information is not intended to replace advice given to you by your health care provider. Make sure you discuss any questions you have with your health care provider.   Document Released: 12/16/1999 Document Revised: 12/23/2012 Document Reviewed: 07/17/2012 Elsevier Interactive Patient Education 2016 [place].        Please contact your physician with any problems or Same Day Surgery at [phone], Monday through Friday 6 am to 4 pm, or [place] at [facility] number at [phone].

## 2015-10-20 NOTE — Progress Notes (Signed)
Initial paper H&P reviewed:   History reviewed, patient examined, no change in status, stable for surgery.  

## 2015-10-20 NOTE — Transfer of Care (Signed)
Immediate Anesthesia Transfer of Care Note  Patient: [name]  Procedure(s) Performed: Procedure(s): DILATATION AND CURETTAGE /HYSTEROSCOPY (N/A)  Patient Location: PACU  Anesthesia Type:General  Level of Consciousness: sedated and responds to stimulation  Airway & Oxygen Therapy: Patient Spontanous Breathing and Patient connected to face mask oxygen  Post-op Assessment: Report given to RN and Post -op Vital signs reviewed and stable  Post vital signs: Reviewed and stable  Last Vitals:  Vitals:   10/20/15 0621 10/20/15 0831  BP: 119/80 107/80  Pulse: 79 (!) 115  Resp: 12 14  Temp: 36.8 C     Last Pain:  Vitals:   10/20/15 0621  TempSrc: Tympanic         Complications: No apparent anesthesia complications

## 2015-10-20 NOTE — Progress Notes (Signed)
Peri pad dry.

## 2015-10-20 NOTE — Anesthesia Postprocedure Evaluation (Signed)
Anesthesia Post Note  Patient: Judy Sims  Procedure(s) Performed: Procedure(s) (LRB): DILATATION AND CURETTAGE /HYSTEROSCOPY (N/A)  Patient location during evaluation: PACU Anesthesia Type: General Level of consciousness: awake and alert Pain management: pain level controlled Vital Signs Assessment: post-procedure vital signs reviewed and stable Respiratory status: spontaneous breathing, nonlabored ventilation, respiratory function stable and patient connected to nasal cannula oxygen Cardiovascular status: blood pressure returned to baseline and stable Postop Assessment: no signs of nausea or vomiting Anesthetic complications: no    Last Vitals:  Vitals:   10/20/15 0919 10/20/15 0945  BP: 115/65 115/73  Pulse: 90 99  Resp: 16 16  Temp: 36.4 C     Last Pain:  Vitals:   10/20/15 0945  TempSrc:   PainSc: 0-No pain                 Gerrit Rafalski S

## 2015-10-24 LAB — SURGICAL PATHOLOGY

## 2015-11-10 ENCOUNTER — Ambulatory Visit: Payer: Self-pay

## 2015-11-14 NOTE — Patient Instructions (Signed)
  Your procedure is scheduled on: 11-22-15 (TUESDAY) Report to Same Day Surgery 2nd floor medical mall To find out your arrival time please call [phone] between [place] on 11-21-15 [facility])  Remember: Instructions that are not followed completely may result in serious medical risk, up to and including death, or upon the discretion of your surgeon and anesthesiologist your surgery may need to be rescheduled.    _x___ 1. Do not eat food or drink liquids after midnight. No gum chewing or hard candies.     __x__ 2. No Alcohol for 24 hours before or after surgery.   __x__3. No Smoking for 24 prior to surgery.   ____  4. Bring all medications with you on the day of surgery if instructed.    __x__ 5. Notify your doctor if there is any change in your medical condition     (cold, fever, infections).     Do not wear jewelry, make-up, hairpins, clips or nail polish.  Do not wear lotions, powders, or perfumes. You may wear deodorant.  Do not shave 48 hours prior to surgery. Men may shave face and neck.  Do not bring valuables to the hospital.    [facility] is not responsible for any belongings or valuables.               Contacts, dentures or bridgework may not be worn into surgery.  Leave your suitcase in the car. After surgery it may be brought to your room.  For patients admitted to the hospital, discharge time is determined by your treatment team.   Patients discharged the day of surgery will not be allowed to drive home.    Please read over the following fact sheets that you were given:   [facility] Preparing for Surgery and or MRSA Information   _x___ Take these medicines the morning of surgery with A SIP OF WATER:    1. PRILOSEC (OMEPRAZOLE)  2. TAKE A PRILOSEC ON Monday NIGHT BEFORE BED (11-21-15)  3. MAY TAKE XANAX IF NEEDED AM OF SURGERY  4.  5.  6.  ____Fleets enema or Magnesium Citrate as directed.   _x___ Use CHG Soap or sage wipes as directed on instruction  sheet   ____ Use inhalers on the day of surgery and bring to hospital day of surgery  ____ Stop metformin 2 days prior to surgery    ____ Take 1/2 of usual insulin dose the night before surgery and none on the morning of surgery.   _X___ Stop aspirin or coumadin, or plavix-STOP EXCEDRIN MIGRAINE NOW  x__ Stop Anti-inflammatories such as Advil, Aleve, Ibuprofen, Motrin, Naproxen,          Naprosyn, Goodies powders or aspirin products NOW-Ok to take Tylenol.   ____ Stop supplements until after surgery.    ____ Bring C-Pap to the hospital.

## 2015-11-16 ENCOUNTER — Encounter
Admission: RE | Admit: 2015-11-16 | Discharge: 2015-11-16 | Disposition: A | Discharge status: Home / Self Care | Payer: 59 | Source: Ambulatory Visit | Attending: Obstetrics and Gynecology | Admitting: Obstetrics and Gynecology

## 2015-11-16 DIAGNOSIS — Z01812 Encounter for preprocedural laboratory examination: Secondary | ICD-10-CM | POA: Diagnosis not present

## 2015-11-16 LAB — BASIC METABOLIC PANEL
Anion gap: 7 (ref 5–15)
BUN: 13 mg/dL (ref 6–20)
CHLORIDE: 105 mmol/L (ref 101–111)
CO2: 26 mmol/L (ref 22–32)
CREATININE: 0.85 mg/dL (ref 0.44–1.00)
Calcium: 9.3 mg/dL (ref 8.9–10.3)
GFR calc non Af Amer: >60 mL/min (ref >60)
GLUCOSE: 104 mg/dL — AB (ref 65–99)
Potassium: 4.2 mmol/L (ref 3.5–5.1)
Sodium: 138 mmol/L (ref 135–145)

## 2015-11-16 LAB — CBC
HCT: 39 % (ref 35.0–47.0)
HEMOGLOBIN: 13.8 g/dL (ref 12.0–16.0)
MCH: 30.8 pg (ref 26.0–34.0)
MCHC: 35.4 g/dL (ref 32.0–36.0)
MCV: 87 fL (ref 80.0–100.0)
PLATELETS: 285 10*3/uL (ref 150–440)
RBC: 4.49 MIL/uL (ref 3.80–5.20)
RDW: 13.1 % (ref 11.5–14.5)
WBC: 6.4 10*3/uL (ref 3.6–11.0)

## 2015-11-16 LAB — TYPE AND SCREEN
ABO/RH(D): O NEG
ANTIBODY SCREEN: NEGATIVE

## 2015-11-22 ENCOUNTER — Observation Stay
Admission: RE | Admit: 2015-11-22 | Discharge: 2015-11-23 | Disposition: A | Discharge status: Home / Self Care | Payer: 59 | Source: Ambulatory Visit | Attending: Obstetrics and Gynecology | Admitting: Obstetrics and Gynecology

## 2015-11-22 ENCOUNTER — Ambulatory Visit: Payer: 59 | Admitting: Anesthesiology

## 2015-11-22 ENCOUNTER — Encounter
Admission: RE | Disposition: A | Discharge status: Home / Self Care | Payer: Self-pay | Source: Ambulatory Visit | Attending: Obstetrics and Gynecology

## 2015-11-22 ENCOUNTER — Encounter: Payer: Self-pay | Admitting: *Deleted

## 2015-11-22 DIAGNOSIS — D252 Subserosal leiomyoma of uterus: Secondary | ICD-10-CM | POA: Diagnosis not present

## 2015-11-22 DIAGNOSIS — N946 Dysmenorrhea, unspecified: Secondary | ICD-10-CM | POA: Insufficient documentation

## 2015-11-22 DIAGNOSIS — K219 Gastro-esophageal reflux disease without esophagitis: Secondary | ICD-10-CM | POA: Insufficient documentation

## 2015-11-22 DIAGNOSIS — D649 Anemia, unspecified: Secondary | ICD-10-CM | POA: Diagnosis not present

## 2015-11-22 DIAGNOSIS — D251 Intramural leiomyoma of uterus: Secondary | ICD-10-CM | POA: Diagnosis not present

## 2015-11-22 DIAGNOSIS — N920 Excessive and frequent menstruation with regular cycle: Principal | ICD-10-CM | POA: Insufficient documentation

## 2015-11-22 DIAGNOSIS — F419 Anxiety disorder, unspecified: Secondary | ICD-10-CM | POA: Insufficient documentation

## 2015-11-22 DIAGNOSIS — R011 Cardiac murmur, unspecified: Secondary | ICD-10-CM | POA: Insufficient documentation

## 2015-11-22 DIAGNOSIS — Z90710 Acquired absence of both cervix and uterus: Secondary | ICD-10-CM | POA: Diagnosis present

## 2015-11-22 HISTORY — PX: LAPAROSCOPIC HYSTERECTOMY: SHX1926

## 2015-11-22 HISTORY — PX: CYSTOSCOPY: SHX5120

## 2015-11-22 HISTORY — PX: LAPAROSCOPIC BILATERAL SALPINGECTOMY: SHX5889

## 2015-11-22 LAB — ABO/RH: ABO/RH(D): O NEG

## 2015-11-22 SURGERY — HYSTERECTOMY, TOTAL, LAPAROSCOPIC
Anesthesia: General

## 2015-11-22 MED ORDER — PHENYLEPHRINE HCL 10 MG/ML IJ SOLN
INTRAMUSCULAR | Status: DC | PRN
  Administered 2015-11-22: 100 ug via INTRAVENOUS

## 2015-11-22 MED ORDER — FENTANYL CITRATE (PF) 100 MCG/2ML IJ SOLN
25.0000 ug | INTRAMUSCULAR | Status: AC | PRN
  Administered 2015-11-22 (×6): 25 ug via INTRAVENOUS

## 2015-11-22 MED ORDER — MENTHOL 3 MG MT LOZG
1.0000 | LOZENGE | OROMUCOSAL | Status: DC | PRN
  Filled 2015-11-22: qty 9

## 2015-11-22 MED ORDER — ONDANSETRON HCL 4 MG/2ML IJ SOLN
INTRAMUSCULAR | Status: DC | PRN
  Administered 2015-11-22: 4 mg via INTRAVENOUS

## 2015-11-22 MED ORDER — FENTANYL CITRATE (PF) 100 MCG/2ML IJ SOLN
INTRAMUSCULAR | Status: AC
  Filled 2015-11-22: qty 2

## 2015-11-22 MED ORDER — SUGAMMADEX SODIUM 200 MG/2ML IV SOLN
INTRAVENOUS | Status: DC | PRN
  Administered 2015-11-22: 140 mg via INTRAVENOUS

## 2015-11-22 MED ORDER — SODIUM CHLORIDE 0.9 % IJ SOLN
INTRAMUSCULAR | Status: AC
  Filled 2015-11-22: qty 10

## 2015-11-22 MED ORDER — ROCURONIUM BROMIDE 100 MG/10ML IV SOLN
INTRAVENOUS | Status: DC | PRN
  Administered 2015-11-22: 40 mg via INTRAVENOUS
  Administered 2015-11-22: 10 mg via INTRAVENOUS

## 2015-11-22 MED ORDER — ONDANSETRON HCL 4 MG/2ML IJ SOLN: 4.0000 mg | Freq: Once | INTRAMUSCULAR | Status: DC | PRN

## 2015-11-22 MED ORDER — GLYCOPYRROLATE 0.2 MG/ML IJ SOLN
INTRAMUSCULAR | Status: DC | PRN
  Administered 2015-11-22: 0.2 mg via INTRAVENOUS

## 2015-11-22 MED ORDER — KETOROLAC TROMETHAMINE 30 MG/ML IJ SOLN
30.0000 mg | Freq: Four times a day (QID) | INTRAMUSCULAR | Status: DC
  Administered 2015-11-22 – 2015-11-23 (×3): 30 mg via INTRAVENOUS
  Filled 2015-11-22 (×3): qty 1

## 2015-11-22 MED ORDER — FENTANYL CITRATE (PF) 100 MCG/2ML IJ SOLN
INTRAMUSCULAR | Status: DC | PRN
  Administered 2015-11-22: 100 ug via INTRAVENOUS

## 2015-11-22 MED ORDER — OXYCODONE-ACETAMINOPHEN 5-325 MG PO TABS
1.0000 | ORAL_TABLET | ORAL | Status: DC | PRN
  Administered 2015-11-23: 1 via ORAL
  Filled 2015-11-22: qty 1

## 2015-11-22 MED ORDER — PROPOFOL 10 MG/ML IV BOLUS
INTRAVENOUS | Status: DC | PRN
  Administered 2015-11-22: 170 mg via INTRAVENOUS

## 2015-11-22 MED ORDER — CEFAZOLIN SODIUM-DEXTROSE 2-4 GM/100ML-% IV SOLN
2.0000 g | Freq: Once | INTRAVENOUS | Status: AC
  Administered 2015-11-22: 2 g via INTRAVENOUS

## 2015-11-22 MED ORDER — ONDANSETRON HCL 4 MG/2ML IJ SOLN: 4.0000 mg | Freq: Four times a day (QID) | INTRAMUSCULAR | Status: DC | PRN

## 2015-11-22 MED ORDER — DEXTROSE-NACL 5-0.45 % IV SOLN
INTRAVENOUS | Status: DC
  Administered 2015-11-22: 23:00:00 via INTRAVENOUS
  Administered 2015-11-22: 1000 mL via INTRAVENOUS
  Administered 2015-11-23: 07:00:00 via INTRAVENOUS

## 2015-11-22 MED ORDER — DIPHENHYDRAMINE HCL 25 MG PO CAPS: 50.0000 mg | ORAL_CAPSULE | Freq: Four times a day (QID) | ORAL | Status: DC | PRN

## 2015-11-22 MED ORDER — CEFAZOLIN SODIUM-DEXTROSE 2-4 GM/100ML-% IV SOLN
INTRAVENOUS | Status: AC
  Administered 2015-11-22: 2 g via INTRAVENOUS
  Filled 2015-11-22: qty 100

## 2015-11-22 MED ORDER — LACTATED RINGERS IV SOLN
INTRAVENOUS | Status: DC
  Administered 2015-11-22: 50 mL/h via INTRAVENOUS

## 2015-11-22 MED ORDER — PROMETHAZINE HCL 25 MG/ML IJ SOLN
12.5000 mg | Freq: Four times a day (QID) | INTRAMUSCULAR | Status: DC | PRN
  Administered 2015-11-22: 12.5 mg via INTRAVENOUS
  Filled 2015-11-22: qty 1

## 2015-11-22 MED ORDER — SIMETHICONE 80 MG PO CHEW: 80.0000 mg | CHEWABLE_TABLET | Freq: Four times a day (QID) | ORAL | Status: DC | PRN

## 2015-11-22 MED ORDER — BUPIVACAINE HCL 0.5 % IJ SOLN
INTRAMUSCULAR | Status: DC | PRN
  Administered 2015-11-22: 20 mL

## 2015-11-22 MED ORDER — ONDANSETRON HCL 4 MG PO TABS: 4.0000 mg | ORAL_TABLET | Freq: Four times a day (QID) | ORAL | Status: DC | PRN

## 2015-11-22 MED ORDER — LIDOCAINE HCL (CARDIAC) 20 MG/ML IV SOLN
INTRAVENOUS | Status: DC | PRN
  Administered 2015-11-22: 60 mg via INTRAVENOUS

## 2015-11-22 MED ORDER — KETOROLAC TROMETHAMINE 30 MG/ML IJ SOLN: 30.0000 mg | Freq: Four times a day (QID) | INTRAMUSCULAR | Status: DC

## 2015-11-22 MED ORDER — DEXAMETHASONE SODIUM PHOSPHATE 10 MG/ML IJ SOLN
INTRAMUSCULAR | Status: DC | PRN
  Administered 2015-11-22: 4 mg via INTRAVENOUS

## 2015-11-22 MED ORDER — KETOROLAC TROMETHAMINE 30 MG/ML IJ SOLN
INTRAMUSCULAR | Status: DC | PRN
  Administered 2015-11-22: 30 mg via INTRAVENOUS

## 2015-11-22 MED ORDER — BUPIVACAINE HCL (PF) 0.5 % IJ SOLN
INTRAMUSCULAR | Status: AC
  Filled 2015-11-22: qty 30

## 2015-11-22 MED ORDER — MIDAZOLAM HCL 5 MG/5ML IJ SOLN
INTRAMUSCULAR | Status: DC | PRN
  Administered 2015-11-22: 2 mg via INTRAVENOUS

## 2015-11-22 MED ORDER — MORPHINE SULFATE (PF) 4 MG/ML IV SOLN: 4.0000 mg | INTRAVENOUS | Status: DC | PRN

## 2015-11-22 SURGICAL SUPPLY — 50 items
APPLICATOR ARISTA FLEXITIP XL (MISCELLANEOUS) ×4 IMPLANT
BAG URO DRAIN 2000ML W/SPOUT (MISCELLANEOUS) ×4 IMPLANT
BLADE SURG SZ11 CARB STEEL (BLADE) ×4 IMPLANT
CANISTER SUCT 1200ML W/VALVE (MISCELLANEOUS) ×4 IMPLANT
CATH FOLEY 2WAY  5CC 16FR (CATHETERS) ×2
CATH ROBINSON RED A/P 16FR (CATHETERS) ×4 IMPLANT
CATH URTH 16FR FL 2W BLN LF (CATHETERS) ×2 IMPLANT
CHLORAPREP W/TINT 26ML (MISCELLANEOUS) ×4 IMPLANT
DEFOGGER SCOPE WARMER CLEARIFY (MISCELLANEOUS) ×4 IMPLANT
DRAPE UNDER BUTTOCK W/FLU (DRAPES) ×4 IMPLANT
GLOVE BIO SURGEON STRL SZ7 (GLOVE) ×16 IMPLANT
GLOVE INDICATOR 7.5 STRL GRN (GLOVE) ×4 IMPLANT
GOWN STRL REUS W/ TWL LRG LVL3 (GOWN DISPOSABLE) ×4 IMPLANT
GOWN STRL REUS W/ TWL XL LVL3 (GOWN DISPOSABLE) ×2 IMPLANT
GOWN STRL REUS W/TWL LRG LVL3 (GOWN DISPOSABLE) ×4
GOWN STRL REUS W/TWL XL LVL3 (GOWN DISPOSABLE) ×2
HEMOSTAT ARISTA ABSORB 1G (MISCELLANEOUS) ×4 IMPLANT
IRRIGATION STRYKERFLOW (MISCELLANEOUS) ×2 IMPLANT
IRRIGATOR STRYKERFLOW (MISCELLANEOUS) ×4
IV LACTATED RINGERS 1000ML (IV SOLUTION) ×4 IMPLANT
IV NS 1000ML (IV SOLUTION) ×2
IV NS 1000ML BAXH (IV SOLUTION) ×2 IMPLANT
KIT PINK PAD W/HEAD ARE REST (MISCELLANEOUS) ×4
KIT PINK PAD W/HEAD ARM REST (MISCELLANEOUS) ×2 IMPLANT
LABEL OR SOLS (LABEL) ×4 IMPLANT
LIQUID BAND (GAUZE/BANDAGES/DRESSINGS) ×4 IMPLANT
MANIPULATOR VCARE LG CRV RETR (MISCELLANEOUS) IMPLANT
MANIPULATOR VCARE SML CRV RETR (MISCELLANEOUS) IMPLANT
MANIPULATOR VCARE STD CRV RETR (MISCELLANEOUS) IMPLANT
NS IRRIG 1000ML POUR BTL (IV SOLUTION) ×4 IMPLANT
NS IRRIG 500ML POUR BTL (IV SOLUTION) ×4 IMPLANT
OCCLUDER COLPOPNEUMO (BALLOONS) IMPLANT
PACK GYN LAPAROSCOPIC (MISCELLANEOUS) ×4 IMPLANT
PAD OB MATERNITY 4.3X12.25 (PERSONAL CARE ITEMS) ×4 IMPLANT
PAD PREP 24X41 OB/GYN DISP (PERSONAL CARE ITEMS) ×4 IMPLANT
SET CYSTO W/LG BORE CLAMP LF (SET/KITS/TRAYS/PACK) ×4 IMPLANT
SHEARS HARMONIC ACE PLUS 36CM (ENDOMECHANICALS) ×4 IMPLANT
SLEEVE ENDOPATH XCEL 5M (ENDOMECHANICALS) ×8 IMPLANT
SPONGE LAP 18X18 5 PK (GAUZE/BANDAGES/DRESSINGS) ×4 IMPLANT
SPONGE XRAY 4X4 16PLY STRL (MISCELLANEOUS) ×4 IMPLANT
STRAP SAFETY BODY (MISCELLANEOUS) ×4 IMPLANT
SURGILUBE 2OZ TUBE FLIPTOP (MISCELLANEOUS) ×4 IMPLANT
SUT VIC AB 0 CT1 27 (SUTURE) ×2
SUT VIC AB 0 CT1 27XCR 8 STRN (SUTURE) ×2 IMPLANT
SUT VIC AB 2-0 UR6 27 (SUTURE) ×4 IMPLANT
SUT VIC AB 4-0 FS2 27 (SUTURE) ×8 IMPLANT
SYR 50ML LL SCALE MARK (SYRINGE) ×4 IMPLANT
SYRINGE 10CC LL (SYRINGE) ×4 IMPLANT
TROCAR XCEL NON-BLD 5MMX100MML (ENDOMECHANICALS) ×4 IMPLANT
TUBING INSUFFLATOR HEATED (MISCELLANEOUS) ×4 IMPLANT

## 2015-11-22 NOTE — Transfer of Care (Signed)
Immediate Anesthesia Transfer of Care Note  Patient: Judy Sims  Procedure(s) Performed: Procedure(s): HYSTERECTOMY TOTAL LAPAROSCOPIC (N/A) LAPAROSCOPIC BILATERAL SALPINGECTOMY (Bilateral) CYSTOSCOPY (N/A)  Patient Location: PACU  Anesthesia Type:General  Level of Consciousness: sedated  Airway & Oxygen Therapy: Patient Spontanous Breathing and Patient connected to face mask oxygen  Post-op Assessment: Report given to RN and Post -op Vital signs reviewed and stable  Post vital signs: Reviewed and stable  Last Vitals:  Vitals:   11/22/15 1058 11/22/15 1413  BP: 116/72 111/69  Pulse: 74 84  Resp: 16 11  Temp: 36.9 C 36.4 C    Last Pain:  Vitals:   11/22/15 1413  TempSrc:   PainSc: Asleep      Patients Stated Pain Goal: 0 ([id number])  Complications: No apparent anesthesia complications

## 2015-11-22 NOTE — Anesthesia Procedure Notes (Signed)
Procedure Name: Intubation Date/Time: 11/22/2015 12:13 PM Performed by: Dionne Bucy Pre-anesthesia Checklist: Patient identified, Patient being monitored, Timeout performed, Emergency Drugs available and Suction available Patient Re-evaluated:Patient Re-evaluated prior to inductionOxygen Delivery Method: Circle system utilized Preoxygenation: Pre-oxygenation with 100% oxygen Intubation Type: IV induction Ventilation: Mask ventilation without difficulty Laryngoscope Size: Mac and 3 Grade View: Grade I Tube type: Oral Tube size: 7.0 mm Number of attempts: 1 Airway Equipment and Method: Stylet Placement Confirmation: ETT inserted through vocal cords under direct vision,  positive ETCO2 and breath sounds checked- equal and bilateral Secured at: 21 cm Tube secured with: Tape Dental Injury: Teeth and Oropharynx as per pre-operative assessment

## 2015-11-22 NOTE — Op Note (Signed)
Preoperative Diagnosis: 1) 40 y.o. 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 with menorrhagia and dysmenorrhea and dysmenorrhea, preoperative work included normal ultrasound, normal endometrial biopsy, and aborted attempt at endometrial ablation secondary to cavity shape  Surgeon: Malachy Mood, MD  Assistant: Prentice Docker, MD  Anesthesia: General  Preoperative Antibiotics: 2g ancef  Estimated Blood Loss:41mL  IV Fluids: 867mL  Urine Output:: 51mL  Drains or Tubes: none  Implants: none  Specimens Removed: Uterus, cervix, and tubes  Complications: none  Intraoperative Findings: Normal tubes, ovaries, and uterus.  Normal appendix and liver.  Cystoscopy at conclusion of the case revealed intact bladder dome and bilateral efflux of urine from both ureters.  Patient Condition: stable  Procedure in Detail:  Patient was taken to the operating room where she was administered general anesthesia.  She was positioned in the dorsal lithotomy position utilizing Allen stirups, prepped and draped in the usual sterile fashion.  Prior to proceeding with procedure a time out was performed.  Attention was turned to the patient's pelvis.  An indwelling foley catheter was used to decompress the patient's bladder.  An operative speculum was placed to allow visualization of the cervix.  The anterior lip of the cervix was grasped with a single tooth tenaculum, and a large V-care retractor was placed to allow manipulation of the uterus.  The operative speculum and single tooth tenaculum were then removed, the cup of the V-care device was snug against the cervix.  Attention was turned  to the patient's abdomen.  The umbilicus was infiltrated with 1% Sensorcaine, before making a stab incision using an 11 blade scalpel.  A 35mm Excel trocar was then used to gain direct entry into the peritoneal cavity utilizing the camera to visualize progress of the trocar during placement.  Once peritoneal entry had been achieved, insufflation was started and pneumoperitoneum established at a pressure of 83mmHg.   The inferior epigastric were identified, the abdominal wall was transilluminated and a left and right later port site wre injected with 1% Sensorcaine before making a stab incision using an 11 blade scalpel.  Two 30mm Excel trocars were placed through these incision under direct visualization. General inspection of the abdomen revealed the above noted findings.    Ureters were identified.  Attention was turned to the right adnexal structures.  The fimbriated portion of the right fallopian tube was grasped, then transected from it attachments to the ovary and mesosalpinx using a 36mm harmonic scalpel. The utero ovarian and round ligaments were then likewise transected using the harmonic.  The anterior leaf of the broad ligament was dissected down to the level of the internal cervical os, and a bladder flap was started.  The posterior leaf of the broad ligament was dissected down to the level of the left uterosacral ligament.  The left uterine artery was skeletonized, ligated and transected using the harmonic.  Attention was then turned to the patient left adnexal structure which were dissected in a similar fashion.  The bladder was fully mobilized off the V-care cup, and while applying cephalad pressure on the V-care a hysterotomy was started using the harmonic and carried around circumferentially freeing the specimen.  The uterus was removed vaginally.  An operative speculum was placed.  The vaginal  cuff was closed via vaginal approach using interrupted 0 Vicryl figure of eight sutures.  The cuff was  completely closed on bimanual and noted to be hemostatic.  Cystoscopy was performed noting intact bladder dome and bilateral efflux of urine from both ureteral orifices.  The cuff was re-inspected laparoscopically, the pelvis was irrigated.  All pedicles were hemostatic.  1g of arista powder was applied to all pedicles.  Pneumoperitoneum was evacuated.  The trocars were removed.  TAll trocar sites were then dressed with surgical skin glue.  The Hulka tenaculum was removed.  Sponge needle and instrument counts were correct time two.  The patient tolerated the procedure well and was taken to the recovery room in stable condition.

## 2015-11-22 NOTE — H&P (Signed)
Date of Initial paper H&P: 10/27/2015  History reviewed, patient examined, no change in status, stable for surgery.

## 2015-11-22 NOTE — Anesthesia Preprocedure Evaluation (Signed)
Anesthesia Evaluation  Patient identified by MRN, date of birth, ID band Patient awake    Reviewed: Allergy & Precautions, NPO status , Patient's Chart, lab work & pertinent test results, reviewed documented beta blocker date and time   History of Anesthesia Complications (+) PONV and history of anesthetic complications  Airway Mallampati: II  TM Distance: >3 FB     Dental  (+) Chipped   Pulmonary neg pulmonary ROS,    Pulmonary exam normal        Cardiovascular negative cardio ROS Normal cardiovascular exam+ Valvular Problems/Murmurs      Neuro/Psych  Headaches, Anxiety    GI/Hepatic Neg liver ROS, GERD  Controlled,  Endo/Other  negative endocrine ROS  Renal/GU negative Renal ROS  negative genitourinary   Musculoskeletal negative musculoskeletal ROS (+)   Abdominal   Peds negative pediatric ROS (+)  Hematology  (+) anemia ,   Anesthesia Other Findings   Reproductive/Obstetrics                             Anesthesia Physical  Anesthesia Plan  ASA: II  Anesthesia Plan: General   Post-op Pain Management:    Induction: Intravenous  Airway Management Planned: Oral ETT  Additional Equipment:   Intra-op Plan:   Post-operative Plan: Extubation in OR  Informed Consent: I have reviewed the patients History and Physical, chart, labs and discussed the procedure including the risks, benefits and alternatives for the proposed anesthesia with the patient or authorized representative who has indicated his/her understanding and acceptance.   Dental advisory given  Plan Discussed with: CRNA  Anesthesia Plan Comments:         Anesthesia Quick Evaluation

## 2015-11-23 ENCOUNTER — Encounter: Payer: Self-pay | Admitting: Obstetrics and Gynecology

## 2015-11-23 DIAGNOSIS — N920 Excessive and frequent menstruation with regular cycle: Secondary | ICD-10-CM | POA: Diagnosis not present

## 2015-11-23 LAB — BASIC METABOLIC PANEL
ANION GAP: 4 — AB (ref 5–15)
BUN: 7 mg/dL (ref 6–20)
CHLORIDE: 105 mmol/L (ref 101–111)
CO2: 27 mmol/L (ref 22–32)
Calcium: 8.3 mg/dL — ABNORMAL LOW (ref 8.9–10.3)
Creatinine, Ser: 0.84 mg/dL (ref 0.44–1.00)
GFR calc Af Amer: >60 mL/min (ref >60)
GLUCOSE: 150 mg/dL — AB (ref 65–99)
POTASSIUM: 3.8 mmol/L (ref 3.5–5.1)
Sodium: 136 mmol/L (ref 135–145)

## 2015-11-23 LAB — CBC
HEMATOCRIT: 34.1 % — AB (ref 35.0–47.0)
HEMOGLOBIN: 11.7 g/dL — AB (ref 12.0–16.0)
MCH: 30.1 pg (ref 26.0–34.0)
MCHC: 34.4 g/dL (ref 32.0–36.0)
MCV: 87.6 fL (ref 80.0–100.0)
PLATELETS: 231 10*3/uL (ref 150–440)
RBC: 3.89 MIL/uL (ref 3.80–5.20)
RDW: 13.3 % (ref 11.5–14.5)
WBC: 11.7 10*3/uL — AB (ref 3.6–11.0)

## 2015-11-23 MED ORDER — OXYCODONE-ACETAMINOPHEN 5-325 MG PO TABS: 1.0000 | ORAL_TABLET | ORAL | 0 refills | Status: DC | PRN

## 2015-11-23 MED ORDER — IBUPROFEN 600 MG PO TABS: 600.0000 mg | ORAL_TABLET | Freq: Four times a day (QID) | ORAL | 0 refills | Status: DC | PRN

## 2015-11-23 NOTE — Progress Notes (Signed)
Patient discharged home. Vital signs stable, incisions clean and dry, pain well controlled. Discharge instructions, prescriptions, and follow up appointment given to and reviewed with patient. Patient verbalized understanding, all questions answered. Escorted in wheelchair by auxiliary.    Jonne Ply, RN

## 2015-11-23 NOTE — Anesthesia Postprocedure Evaluation (Signed)
Anesthesia Post Note  Patient: Judy Sims  Procedure(s) Performed: Procedure(s) (LRB): HYSTERECTOMY TOTAL LAPAROSCOPIC (N/A) LAPAROSCOPIC BILATERAL SALPINGECTOMY (Bilateral) CYSTOSCOPY (N/A)  Patient location during evaluation: PACU Anesthesia Type: General Level of consciousness: awake and alert and oriented Pain management: pain level controlled Vital Signs Assessment: post-procedure vital signs reviewed and stable Respiratory status: spontaneous breathing Cardiovascular status: blood pressure returned to baseline Anesthetic complications: no    Last Vitals:  Vitals:   11/23/15 0300 11/23/15 0813  BP: (!) 100/51 101/67  Pulse: 63 75  Resp: 18 20  Temp: 37 C 36.9 C    Last Pain:  Vitals:   11/23/15 0813  TempSrc: Oral  PainSc:                  Chau Savell

## 2015-11-23 NOTE — OR Nursing (Signed)
Addendum to chart from SDS of 11/22/15:  Urine pregnancy obtained prior to surgery but not entered into glucometer was negative. Patient instructed on result.

## 2015-11-23 NOTE — Discharge Instructions (Signed)
Laparoscopically Assisted Vaginal Hysterectomy, Care After °Refer to this sheet in the next few weeks. These instructions provide you with information on caring for yourself after your procedure. Your health care provider may also give you more specific instructions. Your treatment has been planned according to current medical practices, but problems sometimes occur. Call your health care provider if you have any problems or questions after your procedure. °What can I expect after the procedure? °After your procedure, it is typical to have the following: °· Abdominal pain. You will be given pain medicine to control it. °· Sore throat from the breathing tube that was inserted during surgery. ° °Follow these instructions at home: °· Only take over-the-counter or prescription medicines for pain, discomfort, or fever as directed by your health care provider. °· Do not take aspirin. It can cause bleeding. °· Do not drive when taking pain medicine. °· Follow your health care provider's advice regarding diet, exercise, lifting, driving, and general activities. °· Resume your usual diet as directed and allowed. °· Get plenty of rest and sleep. °· Do not douche, use tampons, or have sexual intercourse for at least 6 weeks, or until your health care provider gives you permission. °· Change your bandages (dressings) as directed by your health care provider. °· Monitor your temperature and notify your health care provider of a fever. °· Take showers instead of baths for 2-3 weeks. °· Do not drink alcohol until your health care provider gives you permission. °· If you develop constipation, you may take a mild laxative with your health care provider's permission. Bran foods may help with constipation problems. Drinking enough fluids to keep your urine clear or pale yellow may help as well. °· Try to have someone home with you for 1-2 weeks to help around the house. °· Keep all of your follow-up appointments as directed by your  health care provider. °Contact a health care provider if: °· You have swelling, redness, or increasing pain around your incision sites. °· You have pus coming from your incision. °· You notice a bad smell coming from your incision. °· Your incision breaks open. °· You feel dizzy or lightheaded. °· You have pain or bleeding when you urinate. °· You have persistent diarrhea. °· You have persistent nausea and vomiting. °· You have abnormal vaginal discharge. °· You have a rash. °· You have any type of abnormal reaction or develop an allergy to your medicine. °· You have poor pain control with your prescribed medicine. °Get help right away if: °· You have a fever. °· You have severe abdominal pain. °· You have chest pain. °· You have shortness of breath. °· You faint. °· You have pain, swelling, or redness in your leg. °· You have heavy vaginal bleeding with blood clots. °This information is not intended to replace advice given to you by your health care provider. Make sure you discuss any questions you have with your health care provider. °Document Released: 12/07/2010 Document Revised: 05/26/2015 Document Reviewed: 07/03/2012 °Elsevier Interactive Patient Education © 2017 [employer] ° °

## 2015-11-23 NOTE — Discharge Summary (Signed)
Physician Discharge Summary  Patient ID: Judy Sims MRN: [id number] DOB/AGE: 1975/09/22 40 y.o.  Admit date: 11/22/2015 Discharge date: 11/23/2015  Admission Diagnoses: Menorrhagia and dysmenorrhea  Discharge Diagnoses:  Active Problems:   S/P laparoscopic hysterectomy   Discharged Condition: good  Hospital Course: Patient underwent uncomplicated TLH, BS, and cystoscopy.  Other than some postoperative nausea immediately following surgery the patient did well.  She remained hemodynamically stable, afebrile throughout her admission.  She was discharged home on POD1.  Consults: None  Significant Diagnostic Studies: Results for orders placed or performed during the hospital encounter of 11/22/15 (from the past 24 hour(s))  ABO/Rh     Status: None   Collection Time: 11/22/15 11:05 AM  Result Value Ref Range   ABO/RH(D) O NEG   Pregnancy, urine POC     Status: None   Collection Time: 11/23/15  6:26 AM   Narrative   Urine poct was negative .  Never entered into glucometer in SDS  CBC     Status: Abnormal   Collection Time: 11/23/15  6:37 AM  Result Value Ref Range   WBC 11.7 (H) 3.6 - 11.0 K/uL   RBC 3.89 3.80 - 5.20 MIL/uL   Hemoglobin 11.7 (L) 12.0 - 16.0 g/dL   HCT 34.1 (L) 35.0 - 47.0 %   MCV 87.6 80.0 - 100.0 fL   MCH 30.1 26.0 - 34.0 pg   MCHC 34.4 32.0 - 36.0 g/dL   RDW 13.3 11.5 - 14.5 %   Platelets 231 150 - 440 K/uL  Basic metabolic panel     Status: Abnormal   Collection Time: 11/23/15  6:37 AM  Result Value Ref Range   Sodium 136 135 - 145 mmol/L   Potassium 3.8 3.5 - 5.1 mmol/L   Chloride 105 101 - 111 mmol/L   CO2 27 22 - 32 mmol/L   Glucose, Bld 150 (H) 65 - 99 mg/dL   BUN 7 6 - 20 mg/dL   Creatinine, Ser 0.84 0.44 - 1.00 mg/dL   Calcium 8.3 (L) 8.9 - 10.3 mg/dL   GFR calc non Af Amer >60 >60 mL/min   GFR calc Af Amer >60 >60 mL/min   Anion gap 4 (L) 5 - 15   Treatments: IV hydration  Discharge Exam: Blood pressure (!) 100/51, pulse 63,  temperature 98.6 F (37 C), temperature source Oral, resp. rate 18, height 5\' 5"  (1.651 m), weight 146 lb (66.2 kg), last menstrual period 11/17/2015, SpO2 97 %. General appearance: alert, appears stated age and no distress Resp: no increased work of breathing GI: soft, non-tender; bowel sounds normal; no masses,  no organomegaly and trocar sited D/C/I, umbilical trocar site had dermabond lift up this was reapplied at that site  Disposition: 01-Home or Self Care  Discharge Instructions    Activity as tolerated    Complete by:  As directed    Call MD for:  difficulty breathing, headache or visual disturbances    Complete by:  As directed    Call MD for:  extreme fatigue    Complete by:  As directed    Call MD for:  hives    Complete by:  As directed    Call MD for:  persistant dizziness or light-headedness    Complete by:  As directed    Call MD for:  persistant nausea and vomiting    Complete by:  As directed    Call MD for:  redness, tenderness, or signs of infection (pain, swelling, redness,  odor or green/yellow discharge around incision site)    Complete by:  As directed    Call MD for:  severe uncontrolled pain    Complete by:  As directed    Call MD for:  temperature >100.4    Complete by:  As directed    Diet - low sodium heart healthy    Complete by:  As directed    Driving restriction     Complete by:  As directed    Avoid driving while taking prescription narcotics and until you are able to slam on the brake with full force   Lifting restrictions    Complete by:  As directed    Weight restriction of 10 lbs for 6 weeks   No dressing needed    Complete by:  As directed    Remove dressing in 24 hours    Complete by:  As directed    Sexual acrtivity    Complete by:  As directed    No intercourse, tampons, or anything vaginally for 6 weeks       Medication List    STOP taking these medications   aspirin-acetaminophen-caffeine 250-250-65 MG tablet Commonly known  as:  EXCEDRIN MIGRAINE   HYDROcodone-acetaminophen 5-325 MG tablet Commonly known as:  NORCO/VICODIN     TAKE these medications   ACZONE EX Apply 1 Dose topically daily.   ALPRAZolam 0.5 MG tablet Commonly known as:  XANAX TAKE 1/2 TO 1 TABLET BY MOUTH EVERY NIGHT AT BEDTIME, THEN TAKE 1/2 TO 1 TABLET BY MOUTH EVERY 8 HOURS AS NEEDED FOR ANXIETY What changed:  See the new instructions.   cetirizine 10 MG tablet Commonly known as:  ZYRTEC Take 10 mg by mouth as needed for allergies.   ibuprofen 600 MG tablet Commonly known as:  ADVIL,MOTRIN Take 1 tablet (600 mg total) by mouth every 6 (six) hours as needed. PAIN What changed:  additional instructions   MULTI VITAMIN DAILY Tabs Take 1 tablet by mouth daily.   omeprazole 20 MG capsule Commonly known as:  PRILOSEC Take 20 mg by mouth daily as needed (heartburn).   oxyCODONE-acetaminophen 5-325 MG tablet Commonly known as:  PERCOCET/ROXICET Take 1-2 tablets by mouth every 4 (four) hours as needed for moderate pain (moderate to severe pain (when tolerating fluids)).      Follow-up Information    Ivah Girardot, Stoney Bang, MD Follow up in 1 week(s).   Specialty:  Obstetrics and Gynecology Why:  For wound re-check Contact information: [address] [place] [address] [phone]           Signed: Dorthula Nettles 11/23/2015, 7:59 AM

## 2015-11-26 LAB — SURGICAL PATHOLOGY

## 2015-12-20 ENCOUNTER — Ambulatory Visit
Admission: RE | Admit: 2015-12-20 | Discharge: 2015-12-20 | Disposition: A | Discharge status: Home / Self Care | Payer: 59 | Source: Ambulatory Visit | Attending: Certified Nurse Midwife | Admitting: Certified Nurse Midwife

## 2015-12-20 ENCOUNTER — Other Ambulatory Visit: Payer: Self-pay | Admitting: Certified Nurse Midwife

## 2015-12-20 DIAGNOSIS — Z1231 Encounter for screening mammogram for malignant neoplasm of breast: Secondary | ICD-10-CM

## 2016-08-31 ENCOUNTER — Encounter: Payer: Self-pay | Admitting: Physician Assistant

## 2016-08-31 ENCOUNTER — Ambulatory Visit (INDEPENDENT_AMBULATORY_CARE_PROVIDER_SITE_OTHER): Payer: 59 | Admitting: Physician Assistant

## 2016-08-31 VITALS — BP 100/80 | HR 72 | Temp 98.6°F | Resp 16 | Ht 65.0 in | Wt 153.6 lb

## 2016-08-31 DIAGNOSIS — J301 Allergic rhinitis due to pollen: Secondary | ICD-10-CM

## 2016-08-31 DIAGNOSIS — R5383 Other fatigue: Secondary | ICD-10-CM | POA: Diagnosis not present

## 2016-08-31 NOTE — Progress Notes (Signed)
Patient: Judy Sims Female    DOB: Nov 06, 1975   41 y.o.   MRN: [id number] Visit Date: 08/31/2016  Today's Provider: Mar Daring, PA-C   Chief Complaint  Patient presents with  . URI  . Gastroesophageal Reflux   Subjective:    HPI Upper Respiratory Infection: Patient complains of symptoms of a URI, possible sinusitis. Symptoms include congestion. Onset of symptoms was a few months ago, unchanged since that time. She also c/o fatigue for the past a few months .  She is drinking plenty of fluids. Evaluation to date: seen previously and thought to have a viral URI. Treatment to date: antibiotics. Patient reports that she did e-visit in July and took 7 days of Augmentin. Patient reports symptoms had improved with medication. She has used zyrtec and flonase in the past.  Patient C/O reflux, patient reports symptoms have been present for several years. Patient reports symptoms are present day and night. Patient reports taking omeprazole 20 mg daily only for last week.      Allergies  Allergen Reactions  . Other Rash    CATS, DOGS AND HORSES causes difficulty breathing if around her face  . Sulfa Antibiotics Rash     Current Outpatient Prescriptions:  .  Dapsone (ACZONE EX), Apply 1 Dose topically daily. , Disp: , Rfl:  .  ibuprofen (ADVIL,MOTRIN) 600 MG tablet, Take 1 tablet (600 mg total) by mouth every 6 (six) hours as needed. PAIN, Disp: 40 tablet, Rfl: 0 .  omeprazole (PRILOSEC) 20 MG capsule, Take 20 mg by mouth daily as needed (heartburn). , Disp: , Rfl:  .  ALPRAZolam (XANAX) 0.5 MG tablet, TAKE 1/2 TO 1 TABLET BY MOUTH EVERY NIGHT AT BEDTIME, THEN TAKE 1/2 TO 1 TABLET BY MOUTH EVERY 8 HOURS AS NEEDED FOR ANXIETY (Patient taking differently: THEN TAKE 1/2 TO 1 TABLET BY MOUTH EVERY 8 HOURS AS NEEDED FOR ANXIETY OR SLEEP AT BEDTIME), Disp: 45 tablet, Rfl: 1 .  cetirizine (ZYRTEC) 10 MG tablet, Take 10 mg by mouth as needed for allergies., Disp: , Rfl:  .  Multiple  Vitamin (MULTI VITAMIN DAILY) TABS, Take 1 tablet by mouth daily., Disp: , Rfl:   Review of Systems  Constitutional: Positive for fatigue.  HENT: Positive for congestion, postnasal drip and sinus pressure. Negative for ear pain, rhinorrhea, sinus pain, sneezing, sore throat and trouble swallowing.   Respiratory: Negative.   Cardiovascular: Negative.   Gastrointestinal: Negative.        GERD  Allergic/Immunologic: Negative for environmental allergies.  Neurological: Positive for headaches. Negative for dizziness.    Social History  Substance Use Topics  . Smoking status: Never Smoker  . Smokeless tobacco: Never Used  . Alcohol use Yes     Comment: socially   Objective:   BP 100/80 (BP Location: Left Arm, Patient Position: Sitting, Cuff Size: Large)   Pulse 72   Temp 98.6 F (37 C) (Oral)   Resp 16   Ht 5\' 5"  (1.651 m)   Wt 153 lb 9.6 oz (69.7 kg)   LMP 11/17/2015 (Exact Date)   SpO2 98%   BMI 25.56 kg/m  Vitals:   08/31/16 1000  BP: 100/80  Pulse: 72  Resp: 16  Temp: 98.6 F (37 C)  TempSrc: Oral  SpO2: 98%  Weight: 153 lb 9.6 oz (69.7 kg)  Height: 5\' 5"  (1.651 m)     Physical Exam  Constitutional: She appears well-developed and well-nourished. No distress.  HENT:  Head: Normocephalic and atraumatic.  Right Ear: Hearing, tympanic membrane, external ear and ear canal normal.  Left Ear: Hearing, tympanic membrane, external ear and ear canal normal.  Nose: Mucosal edema (pale) present. No rhinorrhea. Right sinus exhibits no maxillary sinus tenderness and no frontal sinus tenderness. Left sinus exhibits no maxillary sinus tenderness and no frontal sinus tenderness.  Mouth/Throat: Uvula is midline and mucous membranes are normal. Posterior oropharyngeal erythema present. No oropharyngeal exudate or posterior oropharyngeal edema.  Eyes: Pupils are equal, round, and reactive to light. Conjunctivae are normal. Right eye exhibits no discharge. Left eye exhibits no  discharge. No scleral icterus.  Neck: Normal range of motion. Neck supple. No tracheal deviation present. No thyromegaly present.  Cardiovascular: Normal rate, regular rhythm and normal heart sounds.  Exam reveals no gallop and no friction rub.   No murmur heard. Pulmonary/Chest: Effort normal and breath sounds normal. No stridor. No respiratory distress. She has no wheezes. She has no rales.  Lymphadenopathy:    She has no cervical adenopathy.  Skin: Skin is warm and dry. She is not diaphoretic.  Vitals reviewed.       Assessment & Plan:     1. Seasonal allergic rhinitis due to pollen Suspect allergies since symptoms have been present for two months. Advised patient to restart zyrtec and flonase sensimist. She is to call if symptoms persist. She may require short prednisone taper if she does not respond to flonase.  2. Fatigue, unspecified type Add multi-vitamin and she is wanting to increase her physical activity back up as well. She is to call if no changes.        Mar Daring, PA-C  [facility]

## 2016-08-31 NOTE — Patient Instructions (Signed)
Allergic Rhinitis Allergic rhinitis is when the mucous membranes in the nose respond to allergens. Allergens are particles in the air that cause your body to have an allergic reaction. This causes you to release allergic antibodies. Through a chain of events, these eventually cause you to release histamine into the blood stream. Although meant to protect the body, it is this release of histamine that causes your discomfort, such as frequent sneezing, congestion, and an itchy, runny nose. What are the causes? Seasonal allergic rhinitis (hay fever) is caused by pollen allergens that may come from grasses, trees, and weeds. Year-round allergic rhinitis (perennial allergic rhinitis) is caused by allergens such as house dust mites, pet dander, and mold spores. What are the signs or symptoms?  Nasal stuffiness (congestion).  Itchy, runny nose with sneezing and tearing of the eyes. How is this diagnosed? Your health care provider can help you determine the allergen or allergens that trigger your symptoms. If you and your health care provider are unable to determine the allergen, skin or blood testing may be used. Your health care provider will diagnose your condition after taking your health history and performing a physical exam. Your health care provider may assess you for other related conditions, such as asthma, pink eye, or an ear infection. How is this treated? Allergic rhinitis does not have a cure, but it can be controlled by:  Medicines that block allergy symptoms. These may include allergy shots, nasal sprays, and oral antihistamines.  Avoiding the allergen. Hay fever may often be treated with antihistamines in pill or nasal spray forms. Antihistamines block the effects of histamine. There are over-the-counter medicines that may help with nasal congestion and swelling around the eyes. Check with your health care provider before taking or giving this medicine. If avoiding the allergen or the  medicine prescribed do not work, there are many new medicines your health care provider can prescribe. Stronger medicine may be used if initial measures are ineffective. Desensitizing injections can be used if medicine and avoidance does not work. Desensitization is when a patient is given ongoing shots until the body becomes less sensitive to the allergen. Make sure you follow up with your health care provider if problems continue. Follow these instructions at home: It is not possible to completely avoid allergens, but you can reduce your symptoms by taking steps to limit your exposure to them. It helps to know exactly what you are allergic to so that you can avoid your specific triggers. Contact a health care provider if:  You have a fever.  You develop a cough that does not stop easily (persistent).  You have shortness of breath.  You start wheezing.  Symptoms interfere with normal daily activities. This information is not intended to replace advice given to you by your health care provider. Make sure you discuss any questions you have with your health care provider. Document Released: 09/12/2000 Document Revised: 08/19/2015 Document Reviewed: 08/25/2012 Elsevier Interactive Patient Education  2017 [employer]  

## 2016-09-13 ENCOUNTER — Telehealth: Payer: Self-pay | Admitting: Physician Assistant

## 2016-09-13 DIAGNOSIS — J0140 Acute pansinusitis, unspecified: Secondary | ICD-10-CM

## 2016-09-13 MED ORDER — AMOXICILLIN-POT CLAVULANATE 875-125 MG PO TABS: 1.0000 | ORAL_TABLET | Freq: Two times a day (BID) | ORAL | 0 refills | Status: DC

## 2016-09-13 NOTE — Telephone Encounter (Signed)
Patient advised.  Thanks,  -Judy Sims 

## 2016-09-13 NOTE — Telephone Encounter (Signed)
Please advise 

## 2016-09-13 NOTE — Telephone Encounter (Signed)
Augmentin sent to [facility]

## 2016-09-13 NOTE — Telephone Encounter (Signed)
t states she was seen 2 weeks ago for sinus congestion.  Pt states she is not any better.  Pt still has sinus congest, chest congestion and cough.  Pt is asking something to help with this.  Judy Sims

## 2016-11-12 ENCOUNTER — Other Ambulatory Visit: Payer: Self-pay | Admitting: Obstetrics and Gynecology

## 2016-11-12 DIAGNOSIS — Z1231 Encounter for screening mammogram for malignant neoplasm of breast: Secondary | ICD-10-CM

## 2016-12-27 ENCOUNTER — Ambulatory Visit
Admission: RE | Admit: 2016-12-27 | Discharge: 2016-12-27 | Disposition: A | Discharge status: Home / Self Care | Payer: 59 | Source: Ambulatory Visit | Attending: Obstetrics and Gynecology | Admitting: Obstetrics and Gynecology

## 2016-12-27 DIAGNOSIS — Z1231 Encounter for screening mammogram for malignant neoplasm of breast: Secondary | ICD-10-CM | POA: Insufficient documentation

## 2017-02-04 ENCOUNTER — Encounter: Payer: Self-pay | Admitting: Obstetrics and Gynecology

## 2017-02-04 ENCOUNTER — Ambulatory Visit (INDEPENDENT_AMBULATORY_CARE_PROVIDER_SITE_OTHER): Payer: Managed Care, Other (non HMO) | Admitting: Obstetrics and Gynecology

## 2017-02-04 VITALS — BP 110/54 | HR 82 | Ht 65.0 in | Wt 157.0 lb

## 2017-02-04 DIAGNOSIS — Z01419 Encounter for gynecological examination (general) (routine) without abnormal findings: Secondary | ICD-10-CM | POA: Diagnosis not present

## 2017-02-04 NOTE — Progress Notes (Signed)
Gynecology Annual Exam  PCP: Mar Daring, PA-C  Chief Complaint:  Chief Complaint  Patient presents with  . Gynecologic Exam    sharpe pain w/BM since surgery    History of Present Illness: Patient is a 42 y.o. G0P0000 presents for annual exam. The patient has no complaints today.   The patient is sexually active. She currently uses status post hysterectomy for contraception. She denies dyspareunia.  The patient does perform self breast exams.  There is no notable family history of breast or ovarian cancer in her family.  The patient wears seatbelts: yes.   The patient has regular exercise: no.    The patient denies current symptoms of depression.    Review of Systems: Review of Systems  Constitutional: Negative for chills and fever.  HENT: Negative for congestion.   Respiratory: Negative for cough and shortness of breath.   Cardiovascular: Negative for chest pain and palpitations.  Gastrointestinal: Negative for abdominal pain, constipation, diarrhea, heartburn, nausea and vomiting.  Genitourinary: Negative for dysuria, frequency and urgency.  Skin: Negative for itching and rash.  Neurological: Negative for dizziness and headaches.  Endo/Heme/Allergies: Negative for polydipsia.  Psychiatric/Behavioral: Negative for depression.    Past Medical History:  Past Medical History:  Diagnosis Date  . Anemia    H/O YEARS AGO WITH HEAVY PERIODS-NO PROBLEM SINCE   . Anxiety   . Complication of anesthesia    HARD TO WAKE UP AFTER BREAST SURGERY  . GERD (gastroesophageal reflux disease)   . Heart murmur    AS A CHILD  . Migraine    MIGRAINES associated with menstrual cycles  . PONV (postoperative nausea and vomiting)     Past Surgical History:  Past Surgical History:  Procedure Laterality Date  . ABDOMINAL HYSTERECTOMY    . AUGMENTATION MAMMAPLASTY Bilateral 2008  . BREAST ENHANCEMENT SURGERY Left   . BREAST REDUCTION SURGERY  2008  . CYSTOSCOPY N/A 11/22/2015    Procedure: CYSTOSCOPY;  Surgeon: Malachy Mood, MD;  Location: [facility];  Service: Gynecology;  Laterality: N/A;  . HYSTEROSCOPY W/D&C N/A 10/20/2015   Procedure: DILATATION AND CURETTAGE /HYSTEROSCOPY;  Surgeon: Malachy Mood, MD;  Location: [facility];  Service: Gynecology;  Laterality: N/A;  . LAPAROSCOPIC BILATERAL SALPINGECTOMY Bilateral 11/22/2015   Procedure: LAPAROSCOPIC BILATERAL SALPINGECTOMY;  Surgeon: Malachy Mood, MD;  Location: [facility];  Service: Gynecology;  Laterality: Bilateral;  . LAPAROSCOPIC HYSTERECTOMY N/A 11/22/2015   Procedure: HYSTERECTOMY TOTAL LAPAROSCOPIC;  Surgeon: Malachy Mood, MD;  Location: [facility];  Service: Gynecology;  Laterality: N/A;  . LASIK Bilateral 2006  . WISDOM TOOTH EXTRACTION      Gynecologic History:  Patient's last menstrual period was 11/17/2015 (exact date). Contraception: status post hysterectomy Last Pap: Results were: 01/28/2015 no abnormalities  Last mammogram: 12/28/2016 Results were: BI-RAD I   Obstetric History: G0P0000  Family History:  Family History  Problem Relation Age of Onset  . Colon cancer Maternal Grandfather   . Breast cancer Neg Hx     Social History:  Social History   Socioeconomic History  . Marital status: Married    Spouse name: Not on file  . Number of children: Not on file  . Years of education: Not on file  . Highest education level: Not on file  Social Needs  . Financial resource strain: Not on file  . Food insecurity - worry: Not on file  . Food insecurity - inability: Not on file  . Transportation needs - medical: Not on file  .  Transportation needs - non-medical: Not on file  Occupational History  . Not on file  Tobacco Use  . Smoking status: Never Smoker  . Smokeless tobacco: Never Used  Substance and Sexual Activity  . Alcohol use: Yes    Comment: socially  . Drug use: No  . Sexual activity: Yes  Other Topics Concern  . Not on file  Social History Narrative  . Not on  file    Allergies:  Allergies  Allergen Reactions  . Other Rash    CATS, DOGS AND HORSES causes difficulty breathing if around her face  . Sulfa Antibiotics Rash    Medications: Prior to Admission medications   Medication Sig Start Date End Date Taking? Authorizing Provider  ALPRAZolam (XANAX) 0.5 MG tablet TAKE 1/2 TO 1 TABLET BY MOUTH EVERY NIGHT AT BEDTIME, THEN TAKE 1/2 TO 1 TABLET BY MOUTH EVERY 8 HOURS AS NEEDED FOR ANXIETY Patient taking differently: THEN TAKE 1/2 TO 1 TABLET BY MOUTH EVERY 8 HOURS AS NEEDED FOR ANXIETY OR SLEEP AT BEDTIME 09/14/15   Mar Daring, PA-C  amoxicillin-clavulanate (AUGMENTIN) 875-125 MG tablet Take 1 tablet by mouth 2 (two) times daily. 09/13/16   Mar Daring, PA-C  cetirizine (ZYRTEC) 10 MG tablet Take 10 mg by mouth as needed for allergies.    [provider]  Dapsone (ACZONE EX) Apply 1 Dose topically daily.     [provider]  ibuprofen (ADVIL,MOTRIN) 600 MG tablet Take 1 tablet (600 mg total) by mouth every 6 (six) hours as needed. PAIN 11/23/15   Malachy Mood, MD  Multiple Vitamin (MULTI VITAMIN DAILY) TABS Take 1 tablet by mouth daily.    [provider]  omeprazole (PRILOSEC) 20 MG capsule Take 20 mg by mouth daily as needed (heartburn).     [provider]    Physical Exam Vitals: Last menstrual period 11/17/2015.  General: NAD HEENT: normocephalic, anicteric Thyroid: no enlargement, no palpable nodules Pulmonary: No increased work of breathing, CTAB Cardiovascular: RRR, distal pulses 2+ Breast: Breast symmetrical, no tenderness, no palpable nodules or masses, no skin or nipple retraction present, no nipple discharge.  No axillary or supraclavicular lymphadenopathy. Abdomen: NABS, soft, non-tender, non-distended.  Umbilicus without lesions.  No hepatomegaly, splenomegaly or masses palpable. No evidence of hernia  Genitourinary:  External: Normal external female genitalia.  Normal  urethral meatus, normal  Bartholin's and Skene's glands.    Vagina: Normal vaginal mucosa, no evidence of prolapse.    Cervix: surgically absent  Uterus: surgically absent  Adnexa: ovaries non-enlarged, no adnexal masses  Rectal: deferred  Lymphatic: no evidence of inguinal lymphadenopathy Extremities: no edema, erythema, or tenderness Neurologic: Grossly intact Psychiatric: mood appropriate, affect full  Female chaperone present for pelvic and breast  portions of the physical exam  Assessment: 42 y.o. G0P0000 routine annual exam  Plan: Problem List Items Addressed This Visit    None    Visit Diagnoses    Encounter for annual routine gynecological examination    -  Primary      1) Mammogram - recommend yearly screening mammogram.  Mammogram Is up to date   2) STI screening was not offered  3) ASCCP guidelines and rational discussed.  Patient opts for Discontinue screening interval secondary to total laparoscopic hysterectomy 2017  4) Contraception -  N/A s/p hysterectomy  5) Colonoscopy -- Screening recommended starting at age 89 for average risk individuals, age 70 for individuals deemed at increased risk (including African Americans) and recommended to continue  until age 40.  For patient age 75-85 individualized approach is recommended.  Gold standard screening is via colonoscopy, Cologuard screening is an acceptable alternative for patient unwilling or unable to undergo colonoscopy.  "Colorectal cancer screening for average?risk adults: 2018 guideline update from the [place]: A Cancer Journal for Clinicians: May 30, 2016   6) Routine healthcare maintenance including cholesterol, diabetes screening discussed managed by PCP   7) Return in about 1 year (around 02/04/2018) for annual.

## 2017-02-07 ENCOUNTER — Ambulatory Visit (INDEPENDENT_AMBULATORY_CARE_PROVIDER_SITE_OTHER): Payer: Managed Care, Other (non HMO) | Admitting: Physician Assistant

## 2017-02-07 ENCOUNTER — Encounter: Payer: Self-pay | Admitting: Physician Assistant

## 2017-02-07 VITALS — BP 106/64 | HR 80 | Temp 98.9°F | Resp 16 | Wt 154.0 lb

## 2017-02-07 DIAGNOSIS — J111 Influenza due to unidentified influenza virus with other respiratory manifestations: Secondary | ICD-10-CM

## 2017-02-07 DIAGNOSIS — R69 Illness, unspecified: Secondary | ICD-10-CM | POA: Diagnosis not present

## 2017-02-07 LAB — POCT RAPID STREP A (OFFICE): Rapid Strep A Screen: NEGATIVE

## 2017-02-07 NOTE — Progress Notes (Signed)
[name]  Chief Complaint  Patient presents with  . URI    Subjective:    Patient ID: Judy Sims, female    DOB: December 28, 1975, 42 y.o.   MRN: [id number]  Upper Respiratory Infection: Judy Sims is a 42 y.o. female with a past medical history significant for non vaccination for the flu, exposure to flu complaining of symptoms of a URI. Symptoms include congestion and cough. Onset of symptoms was 2 days ago, gradually improving since that time. She also c/o congestion, nasal congestion, non productive cough, post nasal drip and sinus pressure for the past 2 days .  She is drinking plenty of fluids. Evaluation to date: none. Treatment to date: cough suppressants and decongestants. The treatment has provided minimal.   Review of Systems  Constitutional: Positive for diaphoresis and fatigue. Negative for activity change, appetite change, chills, fever and unexpected weight change.  HENT: Positive for congestion, postnasal drip, rhinorrhea, sinus pressure, sinus pain and sore throat. Negative for ear discharge, ear pain and sneezing.   Eyes: Negative.   Respiratory: Positive for cough. Negative for apnea, choking, chest tightness, shortness of breath, wheezing and stridor.   Gastrointestinal: Negative.   Musculoskeletal: Positive for myalgias. Negative for neck pain and neck stiffness.  Neurological: Positive for headaches. Negative for dizziness and light-headedness.  Hematological: Negative for adenopathy.       Objective:   BP 106/64 (BP Location: Right Arm, Patient Position: Sitting, Cuff Size: Normal)   Pulse 80   Temp 98.9 F (37.2 C) (Oral)   Resp 16   Wt 154 lb (69.9 kg)   LMP 11/17/2015 (Exact Date)   BMI 25.63 kg/m   Patient Active Problem List   Diagnosis Date Noted  . S/P laparoscopic hysterectomy 11/22/2015  . Migraines 12/06/2014    Outpatient Encounter Medications as of 02/07/2017  Medication Sig  . ALPRAZolam (XANAX)  0.5 MG tablet TAKE 1/2 TO 1 TABLET BY MOUTH EVERY NIGHT AT BEDTIME, THEN TAKE 1/2 TO 1 TABLET BY MOUTH EVERY 8 HOURS AS NEEDED FOR ANXIETY (Patient taking differently: THEN TAKE 1/2 TO 1 TABLET BY MOUTH EVERY 8 HOURS AS NEEDED FOR ANXIETY OR SLEEP AT BEDTIME)  . cetirizine (ZYRTEC) 10 MG tablet Take 10 mg by mouth as needed for allergies.  . Dapsone (ACZONE EX) Apply 1 Dose topically daily.   Marland Kitchen ibuprofen (ADVIL,MOTRIN) 600 MG tablet Take 1 tablet (600 mg total) by mouth every 6 (six) hours as needed. PAIN  . Multiple Vitamin (MULTI VITAMIN DAILY) TABS Take 1 tablet by mouth daily.  Marland Kitchen omeprazole (PRILOSEC) 20 MG capsule Take 20 mg by mouth daily as needed (heartburn).    No facility-administered encounter medications on file as of 02/07/2017.     Allergies  Allergen Reactions  . Other Rash    CATS, DOGS AND HORSES causes difficulty breathing if around her face  . Sulfa Antibiotics Rash       Physical Exam  Constitutional: She is oriented to person, place, and time. She appears well-developed and well-nourished.  HENT:  Right Ear: External ear normal.  Left Ear: External ear normal.  Mouth/Throat: Posterior oropharyngeal erythema present. No oropharyngeal exudate or posterior oropharyngeal edema.  Eyes: Conjunctivae are normal.  Neck: Neck supple.  Cardiovascular: Normal rate and regular rhythm.  Pulmonary/Chest: Effort normal and breath sounds normal.  Lymphadenopathy:    She has no cervical adenopathy.  Neurological: She is alert and oriented to person, place, and time.  Skin: Skin  is warm and dry.  Psychiatric: She has a normal mood and affect. Her behavior is normal.       Assessment & Plan:   1. Influenza-like illness  Rapid strep negative. No flu swabs here in clinic, but will treat for influenza like illness due to no vaccine and exposure to flu. Patient declines tamiflu. Counseled on OTC treatments with Mucinex and Delsym. Work note provided.   - POCT rapid strep  A  Return if symptoms worsen or fail to improve.   The entirety of the information documented in the History of Present Illness, Review of Systems and Physical Exam were personally obtained by me. Portions of this information were initially documented by Ashley Royalty, CMA and reviewed by me for thoroughness and accuracy.

## 2017-02-07 NOTE — Patient Instructions (Signed)

## 2017-02-18 ENCOUNTER — Telehealth: Payer: Self-pay

## 2017-02-18 NOTE — Telephone Encounter (Signed)
Please advise 

## 2017-02-18 NOTE — Telephone Encounter (Signed)
Pt sent email this am (I don't see it) and hasn't heard back and that's okay.  She has had a hyst over a yr ago, had intercourse this am and is spotting today.  Should she come in?  [phone]

## 2017-02-19 NOTE — Telephone Encounter (Signed)
As long as she does not have any pain and it limited to spotting after intercourse it ok and likely caused because of small vaginal tear or abrasion from intercourse

## 2017-02-19 NOTE — Telephone Encounter (Signed)
Pt aware.

## 2017-02-19 NOTE — Telephone Encounter (Signed)
Message left for patient to call me back.

## 2017-09-27 ENCOUNTER — Telehealth: Payer: Self-pay

## 2017-09-27 NOTE — Telephone Encounter (Signed)
Patient called saying she has had palpitations for the last 2 weeks that come and go. She reports that since yesterday her palpitations have gotten progressivley worse. She denies chest pain, nausea, numbness or tingling in her extremities, dizziness, or shortness of breath.   She reports that she does have migraines, but this has not changed from her baseline. She has not had any increase in caffeine or taken any new herbal supplements. We do not currently have any openings here in the office. Per Tawanna Sat, patient can be seen tomorrow here in the office. Patient was advised and if symptoms worsen go to the ER.

## 2017-09-28 ENCOUNTER — Encounter: Payer: Self-pay | Admitting: Physician Assistant

## 2017-09-28 ENCOUNTER — Ambulatory Visit (INDEPENDENT_AMBULATORY_CARE_PROVIDER_SITE_OTHER): Payer: Managed Care, Other (non HMO) | Admitting: Physician Assistant

## 2017-09-28 VITALS — BP 120/70 | HR 84 | Temp 97.7°F | Resp 16 | Wt 163.0 lb

## 2017-09-28 DIAGNOSIS — R002 Palpitations: Secondary | ICD-10-CM

## 2017-09-28 DIAGNOSIS — R0602 Shortness of breath: Secondary | ICD-10-CM

## 2017-09-28 DIAGNOSIS — R5383 Other fatigue: Secondary | ICD-10-CM

## 2017-09-28 MED ORDER — PROPRANOLOL HCL 10 MG PO TABS: 10.0000 mg | ORAL_TABLET | Freq: Three times a day (TID) | ORAL | 1 refills | Status: DC | PRN

## 2017-09-28 NOTE — Patient Instructions (Signed)
Palpitations A palpitation is the feeling that your heartbeat is irregular or is faster than normal. It may feel like your heart is fluttering or skipping a beat. Palpitations are usually not a serious problem. They may be caused by many things, including smoking, caffeine, alcohol, stress, and certain medicines. Although most causes of palpitations are not serious, palpitations can be a sign of a serious medical problem. In some cases, you may need further medical evaluation. Follow these instructions at home: Pay attention to any changes in your symptoms. Take these actions to help with your condition:  Avoid the following: ? Caffeinated coffee, tea, soft drinks, diet pills, and energy drinks. ? Chocolate. ? Alcohol.  Do not use any tobacco products, such as cigarettes, chewing tobacco, and e-cigarettes. If you need help quitting, ask your health care provider.  Try to reduce your stress and anxiety. Things that can help you relax include: ? Yoga. ? Meditation. ? Physical activity, such as swimming, jogging, or walking. ? Biofeedback. This is a method that helps you learn to use your mind to control things in your body, such as your heartbeats.  Get plenty of rest and sleep.  Take over-the-counter and prescription medicines only as told by your health care provider.  Keep all follow-up visits as told by your health care provider. This is important.  Contact a health care provider if:  You continue to have a fast or irregular heartbeat after 24 hours.  Your palpitations occur more often. Get help right away if:  You have chest pain or shortness of breath.  You have a severe headache.  You feel dizzy or you faint. This information is not intended to replace advice given to you by your health care provider. Make sure you discuss any questions you have with your health care provider. Document Released: 12/16/1999 Document Revised: 05/23/2015 Document Reviewed: 09/02/2014 Elsevier  Interactive Patient Education  2018 [employer]. Propranolol tablets What is this medicine? PROPRANOLOL (proe PRAN oh lole) is a beta-blocker. Beta-blockers reduce the workload on the heart and help it to beat more regularly. This medicine is used to treat high blood pressure, to control irregular heart rhythms (arrhythmias) and to relieve chest pain caused by angina. It may also be helpful after a heart attack. This medicine is also used to prevent migraine headaches, relieve uncontrollable shaking (tremors), and help certain problems related to the thyroid gland and adrenal gland. This medicine may be used for other purposes; ask your health care provider or pharmacist if you have questions. COMMON BRAND NAME(S): Inderal What should I tell my health care provider before I take this medicine? They need to know if you have any of these conditions: -circulation problems or blood vessel disease -diabetes -history of heart attack or heart disease, vasospastic angina -kidney disease -liver disease -lung or breathing disease, like asthma or emphysema -pheochromocytoma -slow heart rate -thyroid disease -an unusual or allergic reaction to propranolol, other beta-blockers, medicines, foods, dyes, or preservatives -pregnant or trying to get pregnant -breast-feeding How should I use this medicine? Take this medicine by mouth with a glass of water. Follow the directions on the prescription label. Take your doses at regular intervals. Do not take your medicine more often than directed. Do not stop taking except on your the advice of your doctor or health care professional. Talk to your pediatrician regarding the use of this medicine in children. Special care may be needed. Overdosage: If you think you have taken too much of this medicine contact  a poison control center or emergency room at once. NOTE: This medicine is only for you. Do not share this medicine with others. What if I miss a dose? If you  miss a dose, take it as soon as you can. If it is almost time for your next dose, take only that dose. Do not take double or extra doses. What may interact with this medicine? Do not take this medicine with any of the following medications: -feverfew -phenothiazines like chlorpromazine, mesoridazine, prochlorperazine, thioridazine This medicine may also interact with the following medications: -aluminum hydroxide gel -antipyrine -antiviral medicines for HIV or AIDS -barbiturates like phenobarbital -certain medicines for blood pressure, heart disease, irregular heart beat -cimetidine -ciprofloxacin -diazepam -fluconazole -haloperidol -isoniazid -medicines for cholesterol like cholestyramine or colestipol -medicines for mental depression -medicines for migraine headache like almotriptan, eletriptan, frovatriptan, naratriptan, rizatriptan, sumatriptan, zolmitriptan -NSAIDs, medicines for pain and inflammation, like ibuprofen or naproxen -phenytoin -rifampin -teniposide -theophylline -thyroid medicines -tolbutamide -warfarin -zileuton This list may not describe all possible interactions. Give your health care provider a list of all the medicines, herbs, non-prescription drugs, or dietary supplements you use. Also tell them if you smoke, drink alcohol, or use illegal drugs. Some items may interact with your medicine. What should I watch for while using this medicine? Visit your doctor or health care professional for regular check ups. Check your blood pressure and pulse rate regularly. Ask your health care professional what your blood pressure and pulse rate should be, and when you should contact them. You may get drowsy or dizzy. Do not drive, use machinery, or do anything that needs mental alertness until you know how this drug affects you. Do not stand or sit up quickly, especially if you are an older patient. This reduces the risk of dizzy or fainting spells. Alcohol can make you more  drowsy and dizzy. Avoid alcoholic drinks. This medicine can affect blood sugar levels. If you have diabetes, check with your doctor or health care professional before you change your diet or the dose of your diabetic medicine. Do not treat yourself for coughs, colds, or pain while you are taking this medicine without asking your doctor or health care professional for advice. Some ingredients may increase your blood pressure. What side effects may I notice from receiving this medicine? Side effects that you should report to your doctor or health care professional as soon as possible: -allergic reactions like skin rash, itching or hives, swelling of the face, lips, or tongue -breathing problems -changes in blood sugar -cold hands or feet -difficulty sleeping, nightmares -dry peeling skin -hallucinations -muscle cramps or weakness -slow heart rate -swelling of the legs and ankles -vomiting Side effects that usually do not require medical attention (report to your doctor or health care professional if they continue or are bothersome): -change in sex drive or performance -diarrhea -dry sore eyes -hair loss -nausea -weak or tired This list may not describe all possible side effects. Call your doctor for medical advice about side effects. You may report side effects to FDA at 1-800-FDA-1088. Where should I keep my medicine? Keep out of the reach of children. Store at room temperature between 15 and 30 degrees C (59 and 86 degrees F). Protect from light. Throw away any unused medicine after the expiration date. NOTE: This sheet is a summary. It may not cover all possible information. If you have questions about this medicine, talk to your doctor, pharmacist, or health care provider.  2018 Elsevier/Gold Standard (2012-08-22 14:51:53)

## 2017-09-28 NOTE — Progress Notes (Signed)
Judy Sims  MRN: [id number] DOB: 11/17/1975  Subjective:  HPI  The patient is a 42 year old female who presents with complaint of palpitations and skipped beats for about 2 weeks.  She states she does not have an increased intake of caffeine  And admits to 1 cup of coffee in the morning with no other caffeinated drinks.   She has had increased fatigue also.  She denies any thyroid disease but states she had a murmur as a child but not as an adult.   She has had some SOB with these episode, no chest pain.  They occur more often in the morning and continue throughout the day but lessen in intensity. No increases external stressors known, however, father-in-law does have prostate cancer and recently stopped all treatment.   Patient Active Problem List   Diagnosis Date Noted  . S/P laparoscopic hysterectomy 11/22/2015  . Migraines 12/06/2014    Past Medical History:  Diagnosis Date  . Anemia    H/O YEARS AGO WITH HEAVY PERIODS-NO PROBLEM SINCE   . Anxiety   . Complication of anesthesia    HARD TO WAKE UP AFTER BREAST SURGERY  . GERD (gastroesophageal reflux disease)   . Heart murmur    AS A CHILD  . Migraine    MIGRAINES associated with menstrual cycles  . PONV (postoperative nausea and vomiting)     Social History   Socioeconomic History  . Marital status: Married    Spouse name: Not on file  . Number of children: Not on file  . Years of education: Not on file  . Highest education level: Not on file  Occupational History  . Not on file  Social Needs  . Financial resource strain: Not on file  . Food insecurity:    Worry: Not on file    Inability: Not on file  . Transportation needs:    Medical: Not on file    Non-medical: Not on file  Tobacco Use  . Smoking status: Never Smoker  . Smokeless tobacco: Never Used  Substance and Sexual Activity  . Alcohol use: Yes    Comment: socially  . Drug use: No  . Sexual activity: Yes  Lifestyle  . Physical activity:   Days per week: 4 days    Minutes per session: 50 min  . Stress: Not at all  Relationships  . Social connections:    Talks on phone: More than three times a week    Gets together: Once a week    Attends religious service: 1 to 4 times per year    Active member of club or organization: No    Attends meetings of clubs or organizations: Never    Relationship status: Married  . Intimate partner violence:    Fear of current or ex partner: No    Emotionally abused: No    Physically abused: No    Forced sexual activity: No  Other Topics Concern  . Not on file  Social History Narrative  . Not on file    Outpatient Encounter Medications as of 09/28/2017  Medication Sig  . ALPRAZolam (XANAX) 0.5 MG tablet TAKE 1/2 TO 1 TABLET BY MOUTH EVERY NIGHT AT BEDTIME, THEN TAKE 1/2 TO 1 TABLET BY MOUTH EVERY 8 HOURS AS NEEDED FOR ANXIETY (Patient taking differently: THEN TAKE 1/2 TO 1 TABLET BY MOUTH EVERY 8 HOURS AS NEEDED FOR ANXIETY OR SLEEP AT BEDTIME)  . cetirizine (ZYRTEC) 10 MG tablet Take 10 mg by mouth  as needed for allergies.  . Dapsone (ACZONE EX) Apply 1 Dose topically daily.   Marland Kitchen ibuprofen (ADVIL,MOTRIN) 600 MG tablet Take 1 tablet (600 mg total) by mouth every 6 (six) hours as needed. PAIN  . Multiple Vitamin (MULTI VITAMIN DAILY) TABS Take 1 tablet by mouth daily.  Marland Kitchen omeprazole (PRILOSEC) 20 MG capsule Take 20 mg by mouth daily as needed (heartburn).   . propranolol (INDERAL) 10 MG tablet Take 1 tablet (10 mg total) by mouth 3 (three) times daily as needed.   No facility-administered encounter medications on file as of 09/28/2017.     Allergies  Allergen Reactions  . Other Rash    CATS, DOGS AND HORSES causes difficulty breathing if around her face  . Sulfa Antibiotics Rash    Review of Systems  Constitutional: Positive for malaise/fatigue.  Respiratory: Positive for shortness of breath (only with episodes palpitations). Negative for cough.   Cardiovascular: Positive for  palpitations. Negative for chest pain, claudication and leg swelling.    Objective:  BP 120/70 (BP Location: Right Arm, Patient Position: Sitting, Cuff Size: Normal)   Pulse 84   Temp 97.7 F (36.5 C) (Oral)   Resp 16   Wt 163 lb (73.9 kg)   LMP 11/17/2015 (Exact Date)   SpO2 99%   BMI 27.12 kg/m   Physical Exam  Constitutional: She is well-developed, well-nourished, and in no distress.  HENT:  Head: Normocephalic and atraumatic.  Eyes: Pupils are equal, round, and reactive to light. Conjunctivae and EOM are normal.  Neck: Normal range of motion. Neck supple.  Cardiovascular: Normal rate, regular rhythm, normal heart sounds and intact distal pulses.  No murmur heard. Pulmonary/Chest: Effort normal and breath sounds normal. No respiratory distress. She has no wheezes.  Musculoskeletal: She exhibits no edema.  Psychiatric: Mood, memory, affect and judgment normal.  Vitals reviewed.  Depression screen [facility] 2/9 09/28/2017 08/31/2016 12/06/2014  Decreased Interest 1 1 0  Down, Depressed, Hopeless 0 0 1  PHQ - 2 Score 1 1 1   Altered sleeping 1 1 -  Tired, decreased energy 1 1 -  Change in appetite 0 1 -  Feeling bad or failure about yourself  0 0 -  Trouble concentrating 0 0 -  Moving slowly or fidgety/restless 0 0 -  Suicidal thoughts 0 0 -  PHQ-9 Score 3 4 -  Difficult doing work/chores Somewhat difficult Somewhat difficult -    Assessment and Plan :   1. Palpitations EKG today shows NSR rate of 86 without ST changes noted. Unchanged from 2014. Will check labs as below for organic cause. If labs normal will proceed with referral to cardiology for consideration of Holter event monitor. Propranolol given as below for prn use with palpitations. Call if symptoms worsen.  - EKG 12-Lead - CBC w/Diff/Platelet - Comprehensive Metabolic Panel (CMET) - TSH - Vitamin D (25 hydroxy) - B12 and Folate Panel - Ambulatory referral to Cardiology - propranolol (INDERAL) 10 MG tablet; Take  1 tablet (10 mg total) by mouth 3 (three) times daily as needed.  Dispense: 30 tablet; Refill: 1  2. Fatigue, unspecified type See above medical treatment plan. - CBC w/Diff/Platelet - Comprehensive Metabolic Panel (CMET) - TSH - Vitamin D (25 hydroxy) - B12 and Folate Panel - Ambulatory referral to Cardiology  3. SOB (shortness of breath) See above medical treatment plan. - CBC w/Diff/Platelet - Comprehensive Metabolic Panel (CMET) - TSH - Vitamin D (25 hydroxy) - B12 and Folate Panel - Ambulatory referral to  Cardiology

## 2017-10-01 LAB — CBC WITH DIFFERENTIAL/PLATELET
BASOS: 0 %
Basophils Absolute: 0 10*3/uL (ref 0.0–0.2)
EOS (ABSOLUTE): 0.1 10*3/uL (ref 0.0–0.4)
EOS: 1 %
Hematocrit: 38.5 % (ref 34.0–46.6)
Hemoglobin: 12.9 g/dL (ref 11.1–15.9)
IMMATURE GRANS (ABS): 0 10*3/uL (ref 0.0–0.1)
IMMATURE GRANULOCYTES: 0 %
LYMPHS: 34 %
Lymphocytes Absolute: 2.5 10*3/uL (ref 0.7–3.1)
MCH: 29.6 pg (ref 26.6–33.0)
MCHC: 33.5 g/dL (ref 31.5–35.7)
MCV: 88 fL (ref 79–97)
Monocytes Absolute: 0.4 10*3/uL (ref 0.1–0.9)
Monocytes: 6 %
NEUTROS ABS: 4.3 10*3/uL (ref 1.4–7.0)
NEUTROS PCT: 59 %
PLATELETS: 307 10*3/uL (ref 150–450)
RBC: 4.36 x10E6/uL (ref 3.77–5.28)
RDW: 13.1 % (ref 12.3–15.4)
WBC: 7.3 10*3/uL (ref 3.4–10.8)

## 2017-10-01 LAB — COMPREHENSIVE METABOLIC PANEL
A/G RATIO: 2.3 — AB (ref 1.2–2.2)
ALT: 11 IU/L (ref 0–32)
AST: 17 IU/L (ref 0–40)
Albumin: 4.6 g/dL (ref 3.5–5.5)
Alkaline Phosphatase: 53 IU/L (ref 39–117)
BILIRUBIN TOTAL: 0.5 mg/dL (ref 0.0–1.2)
BUN/Creatinine Ratio: 13 (ref 9–23)
BUN: 11 mg/dL (ref 6–24)
CALCIUM: 9.4 mg/dL (ref 8.7–10.2)
CO2: 24 mmol/L (ref 20–29)
Chloride: 99 mmol/L (ref 96–106)
Creatinine, Ser: 0.87 mg/dL (ref 0.57–1.00)
GFR, EST AFRICAN AMERICAN: 95 mL/min/{1.73_m2} (ref >59)
GFR, EST NON AFRICAN AMERICAN: 82 mL/min/{1.73_m2} (ref >59)
Globulin, Total: 2 g/dL (ref 1.5–4.5)
Glucose: 114 mg/dL — ABNORMAL HIGH (ref 65–99)
POTASSIUM: 4.5 mmol/L (ref 3.5–5.2)
Sodium: 137 mmol/L (ref 134–144)
Total Protein: 6.6 g/dL (ref 6.0–8.5)

## 2017-10-01 LAB — VITAMIN D 25 HYDROXY (VIT D DEFICIENCY, FRACTURES): Vit D, 25-Hydroxy: 26.1 ng/mL — ABNORMAL LOW (ref 30.0–100.0)

## 2017-10-01 LAB — TSH: TSH: 1.99 u[IU]/mL (ref 0.450–4.500)

## 2017-10-01 LAB — B12 AND FOLATE PANEL
FOLATE: 15.4 ng/mL (ref >3.0)
VITAMIN B 12: 417 pg/mL (ref 232–1245)

## 2017-10-03 DIAGNOSIS — K219 Gastro-esophageal reflux disease without esophagitis: Secondary | ICD-10-CM | POA: Insufficient documentation

## 2017-10-03 DIAGNOSIS — R002 Palpitations: Secondary | ICD-10-CM | POA: Insufficient documentation

## 2017-10-03 DIAGNOSIS — R5383 Other fatigue: Secondary | ICD-10-CM | POA: Insufficient documentation

## 2017-10-03 DIAGNOSIS — R079 Chest pain, unspecified: Secondary | ICD-10-CM | POA: Insufficient documentation

## 2017-11-13 ENCOUNTER — Telehealth: Payer: Self-pay | Admitting: Physician Assistant

## 2017-11-13 NOTE — Telephone Encounter (Signed)
Please Review

## 2017-11-13 NOTE — Telephone Encounter (Signed)
Patient advised as directed below.  Thanks,  -Anea Fodera 

## 2017-11-13 NOTE — Telephone Encounter (Signed)
Can try OTC monistat if has not used. May need to be seen to make sure nothing else going on as well.

## 2017-11-13 NOTE — Telephone Encounter (Signed)
Pt thinks she has a yeast infection and asking if the diflucan pill can be called in for her.     Please advise.  Thanks, [employer]

## 2017-12-03 ENCOUNTER — Other Ambulatory Visit: Payer: Self-pay | Admitting: Obstetrics and Gynecology

## 2017-12-03 DIAGNOSIS — Z1231 Encounter for screening mammogram for malignant neoplasm of breast: Secondary | ICD-10-CM

## 2017-12-30 ENCOUNTER — Ambulatory Visit
Admission: RE | Admit: 2017-12-30 | Discharge: 2017-12-30 | Disposition: A | Discharge status: Home / Self Care | Payer: Managed Care, Other (non HMO) | Source: Ambulatory Visit | Attending: Obstetrics and Gynecology | Admitting: Obstetrics and Gynecology

## 2017-12-30 DIAGNOSIS — Z1231 Encounter for screening mammogram for malignant neoplasm of breast: Secondary | ICD-10-CM | POA: Diagnosis present

## 2017-12-31 ENCOUNTER — Other Ambulatory Visit: Payer: Self-pay | Admitting: Obstetrics and Gynecology

## 2017-12-31 DIAGNOSIS — N6320 Unspecified lump in the left breast, unspecified quadrant: Secondary | ICD-10-CM

## 2017-12-31 DIAGNOSIS — R928 Other abnormal and inconclusive findings on diagnostic imaging of breast: Secondary | ICD-10-CM

## 2018-01-06 ENCOUNTER — Other Ambulatory Visit: Payer: Self-pay | Admitting: Obstetrics and Gynecology

## 2018-01-06 MED ORDER — FLUCONAZOLE 150 MG PO TABS: 150.0000 mg | ORAL_TABLET | Freq: Once | ORAL | 0 refills | Status: AC

## 2018-01-09 ENCOUNTER — Ambulatory Visit
Admission: RE | Admit: 2018-01-09 | Discharge: 2018-01-09 | Disposition: A | Discharge status: Home / Self Care | Payer: Managed Care, Other (non HMO) | Source: Ambulatory Visit | Attending: Obstetrics and Gynecology | Admitting: Obstetrics and Gynecology

## 2018-01-09 DIAGNOSIS — R928 Other abnormal and inconclusive findings on diagnostic imaging of breast: Secondary | ICD-10-CM | POA: Insufficient documentation

## 2018-01-09 DIAGNOSIS — N6320 Unspecified lump in the left breast, unspecified quadrant: Secondary | ICD-10-CM | POA: Diagnosis present

## 2018-01-10 ENCOUNTER — Other Ambulatory Visit: Payer: Self-pay | Admitting: Obstetrics and Gynecology

## 2018-01-10 DIAGNOSIS — N6320 Unspecified lump in the left breast, unspecified quadrant: Secondary | ICD-10-CM

## 2018-02-10 ENCOUNTER — Ambulatory Visit: Payer: Managed Care, Other (non HMO) | Admitting: Obstetrics and Gynecology

## 2018-02-13 ENCOUNTER — Ambulatory Visit: Payer: Managed Care, Other (non HMO) | Admitting: Obstetrics and Gynecology

## 2018-02-26 ENCOUNTER — Ambulatory Visit (INDEPENDENT_AMBULATORY_CARE_PROVIDER_SITE_OTHER): Payer: Managed Care, Other (non HMO) | Admitting: Obstetrics and Gynecology

## 2018-02-26 ENCOUNTER — Encounter: Payer: Self-pay | Admitting: Obstetrics and Gynecology

## 2018-02-26 VITALS — BP 120/80 | Ht 65.0 in | Wt 155.0 lb

## 2018-02-26 DIAGNOSIS — Z01419 Encounter for gynecological examination (general) (routine) without abnormal findings: Secondary | ICD-10-CM

## 2018-02-26 DIAGNOSIS — Z1239 Encounter for other screening for malignant neoplasm of breast: Secondary | ICD-10-CM

## 2018-02-26 DIAGNOSIS — Z23 Encounter for immunization: Secondary | ICD-10-CM | POA: Diagnosis not present

## 2018-02-26 NOTE — Progress Notes (Signed)
Gynecology Annual Exam  PCP: Mar Daring, PA-C  Chief Complaint:  Chief Complaint  Patient presents with  . Gynecologic Exam    History of Present Illness: Patient is a 43 y.o. G0P0000 presents for annual exam. The patient has no complaints today.   LMP: Patient's last menstrual period was 11/17/2015 (exact date). Status post hysterectomy  The patient is sexually active. She currently uses status post hysterectomy for contraception. She denies dyspareunia.  The patient does perform self breast exams.  There is no notable family history of breast or ovarian cancer in her family.  The patient wears seatbelts: yes.   The patient has regular exercise: not asked.    The patient denies current symptoms of depression.    Review of Systems: Review of Systems  Constitutional: Negative for chills and fever.  HENT: Negative for congestion.   Respiratory: Negative for cough and shortness of breath.   Cardiovascular: Negative for chest pain and palpitations.  Gastrointestinal: Negative for abdominal pain, constipation, diarrhea, heartburn, nausea and vomiting.  Genitourinary: Negative for dysuria, frequency and urgency.  Skin: Negative for itching and rash.  Neurological: Negative for dizziness and headaches.  Endo/Heme/Allergies: Negative for polydipsia.  Psychiatric/Behavioral: Negative for depression.    Past Medical History:  Past Medical History:  Diagnosis Date  . Anemia    H/O YEARS AGO WITH HEAVY PERIODS-NO PROBLEM SINCE   . Anxiety   . Complication of anesthesia    HARD TO WAKE UP AFTER BREAST SURGERY  . GERD (gastroesophageal reflux disease)   . Heart murmur    AS A CHILD  . Migraine    MIGRAINES associated with menstrual cycles  . PONV (postoperative nausea and vomiting)     Past Surgical History:  Past Surgical History:  Procedure Laterality Date  . ABDOMINAL HYSTERECTOMY    . AUGMENTATION MAMMAPLASTY Bilateral 2008  . BREAST ENHANCEMENT SURGERY  Left   . BREAST REDUCTION SURGERY  2008  . CYSTOSCOPY N/A 11/22/2015   Procedure: CYSTOSCOPY;  Surgeon: Malachy Mood, MD;  Location: [facility] ORS;  Service: Gynecology;  Laterality: N/A;  . HYSTEROSCOPY W/D&C N/A 10/20/2015   Procedure: DILATATION AND CURETTAGE /HYSTEROSCOPY;  Surgeon: Malachy Mood, MD;  Location: [facility];  Service: Gynecology;  Laterality: N/A;  . LAPAROSCOPIC BILATERAL SALPINGECTOMY Bilateral 11/22/2015   Procedure: LAPAROSCOPIC BILATERAL SALPINGECTOMY;  Surgeon: Malachy Mood, MD;  Location: [facility] OR[facility];  Service: Gynecology;  Laterality: Bilateral;  . LAPAROSCOPIC HYSTERECTOMY N/A 11/22/2015   Procedure: HYSTERECTOMY TOTAL LAPAROSCOPIC;  Surgeon: Malachy Mood, MD;  Location: [facility];  Service: Gynecology;  Laterality: N/A;  . LASIK Bilateral 2006  . WISDOM TOOTH EXTRACTION      Gynecologic History:  Patient's last menstrual period was 11/17/2015 (exact date). Contraception: status post hysterectomy Last Pap: Results were: N/A s/p hysterectomy Last mammogram: 01/09/2018 Results were: BI-RAD III  Obstetric History: G0P0000  Family History:  Family History  Problem Relation Age of Onset  . Colon cancer Maternal Grandfather   . Breast cancer Neg Hx     Social History:  Social History   Socioeconomic History  . Marital status: Married    Spouse name: Not on file  . Number of children: Not on file  . Years of education: Not on file  . Highest education level: Not on file  Occupational History  . Not on file  Social Needs  . Financial resource strain: Not on file  . Food insecurity:    Worry: Not on file    Inability: Not  on file  . Transportation needs:    Medical: Not on file    Non-medical: Not on file  Tobacco Use  . Smoking status: Never Smoker  . Smokeless tobacco: Never Used  Substance and Sexual Activity  . Alcohol use: Yes    Comment: socially  . Drug use: No  . Sexual activity: Yes  Lifestyle  . Physical activity:    Days per  week: 4 days    Minutes per session: 50 min  . Stress: Not at all  Relationships  . Social connections:    Talks on phone: More than three times a week    Gets together: Once a week    Attends religious service: 1 to 4 times per year    Active member of club or organization: No    Attends meetings of clubs or organizations: Never    Relationship status: Married  . Intimate partner violence:    Fear of current or ex partner: No    Emotionally abused: No    Physically abused: No    Forced sexual activity: No  Other Topics Concern  . Not on file  Social History Narrative  . Not on file    Allergies:  Allergies  Allergen Reactions  . Other Rash    CATS, DOGS AND HORSES causes difficulty breathing if around her face  . Sulfa Antibiotics Rash    Medications: Prior to Admission medications   Medication Sig Start Date End Date Taking? Authorizing Provider  ALPRAZolam (XANAX) 0.5 MG tablet TAKE 1/2 TO 1 TABLET BY MOUTH EVERY NIGHT AT BEDTIME, THEN TAKE 1/2 TO 1 TABLET BY MOUTH EVERY 8 HOURS AS NEEDED FOR ANXIETY Patient taking differently: THEN TAKE 1/2 TO 1 TABLET BY MOUTH EVERY 8 HOURS AS NEEDED FOR ANXIETY OR SLEEP AT BEDTIME 09/14/15  Yes Burnette, Anderson Malta M, PA-C  cetirizine (ZYRTEC) 10 MG tablet Take 10 mg by mouth as needed for allergies.   Yes [provider]  Dapsone (ACZONE EX) Apply 1 Dose topically daily.    Yes [provider]  ibuprofen (ADVIL,MOTRIN) 600 MG tablet Take 1 tablet (600 mg total) by mouth every 6 (six) hours as needed. PAIN 11/23/15  Yes Malachy Mood, MD  Multiple Vitamin (MULTI VITAMIN DAILY) TABS Take 1 tablet by mouth daily.   Yes [provider]  omeprazole (PRILOSEC) 20 MG capsule Take 20 mg by mouth daily as needed (heartburn).    Yes [provider]  propranolol (INDERAL) 10 MG tablet Take 1 tablet (10 mg total) by mouth 3 (three) times daily as needed. Patient not taking: Reported on 02/26/2018 09/28/17    Mar Daring, PA-C    Physical Exam Vitals: Blood pressure 120/80, height 5\' 5"  (1.651 m), weight 155 lb (70.3 kg), last menstrual period 11/17/2015.  General: NAD HEENT: normocephalic, anicteric Thyroid: no enlargement, no palpable nodules Pulmonary: No increased work of breathing, CTAB Cardiovascular: RRR, distal pulses 2+ Breast: Breast symmetrical, no tenderness, no palpable nodules or masses, no skin or nipple retraction present, no nipple discharge.  No axillary or supraclavicular lymphadenopathy. Abdomen: NABS, soft, non-tender, non-distended.  Umbilicus without lesions.  No hepatomegaly, splenomegaly or masses palpable. No evidence of hernia  Genitourinary:  External: Normal external female genitalia.  Normal urethral meatus, normal Bartholin's and Skene's glands.    Vagina: Normal vaginal mucosa, no evidence of prolapse.    Cervix: Surgically absent  Uterus: Surgically absent  Adnexa: ovaries non-enlarged, no adnexal masses  Rectal: deferred  Lymphatic: no evidence  of inguinal lymphadenopathy Extremities: no edema, erythema, or tenderness Neurologic: Grossly intact Psychiatric: mood appropriate, affect full  Female chaperone present for pelvic and breast  portions of the physical exam    Assessment: 43 y.o. G0P0000 routine annual exam  Plan: Problem List Items Addressed This Visit    None    Visit Diagnoses    Breast screening    -  Primary   Need for immunization against influenza       Relevant Orders   Flu Vaccine QUAD 36+ mos IM (Completed)   Encounter for gynecological examination without abnormal finding          1) Mammogram - recommend yearly screening mammogram.  Mammogram Is up to date  - Follow up mammogram recommended in 6 months   2) STI screening  was notoffered and therefore not obtained  3) ASCCP guidelines and rational discussed.  Patient opts for discontinue secondary to prior hysterectomy screening interval  4) Contraception -  the patient is currently using  status post hysterectomy.  She is not currently in need of contraception secondary to being sterile  5) Colonoscopy -- Screening recommended starting at age 57 for average risk individuals, age 70 for individuals deemed at increased risk (including African Americans) and recommended to continue until age 11.  For patient age 38-85 individualized approach is recommended.  Gold standard screening is via colonoscopy, Cologuard screening is an acceptable alternative for patient unwilling or unable to undergo colonoscopy.  "Colorectal cancer screening for average?risk adults: 2018 guideline update from the American Cancer Society"CA: A Cancer Journal for Clinicians: May 30, 2016   6) Routine healthcare maintenance including cholesterol, diabetes screening discussed managed by PCP  7) Return in about 1 year (around 02/27/2019) for annual.   Malachy Mood, MD, [facility], [facility] 02/26/2018, 9:54 AM 2

## 2018-07-11 ENCOUNTER — Other Ambulatory Visit: Payer: Managed Care, Other (non HMO)

## 2018-07-18 ENCOUNTER — Ambulatory Visit
Admission: RE | Admit: 2018-07-18 | Discharge: 2018-07-18 | Disposition: A | Discharge status: Home / Self Care | Payer: Managed Care, Other (non HMO) | Source: Ambulatory Visit | Attending: Obstetrics and Gynecology | Admitting: Obstetrics and Gynecology

## 2018-07-18 ENCOUNTER — Other Ambulatory Visit: Payer: Self-pay

## 2018-07-18 DIAGNOSIS — N6320 Unspecified lump in the left breast, unspecified quadrant: Secondary | ICD-10-CM | POA: Diagnosis present

## 2019-02-08 IMAGING — MG MM  DIGITAL SCREENING BREAST BILAT IMPLANT W/ TOMO W/ CAD
8 of 17 series · 8 of 33 positions shown · non-contrast
Comparison: Previous exam(s).

CLINICAL DATA: Screening.

EXAM:
2D DIGITAL SCREENING BILATERAL MAMMOGRAM WITH IMPLANTS, CAD AND
ADJUNCT TOMO
The patient has retropectoral implants. Standard and implant
displaced views were performed.

[R CC]
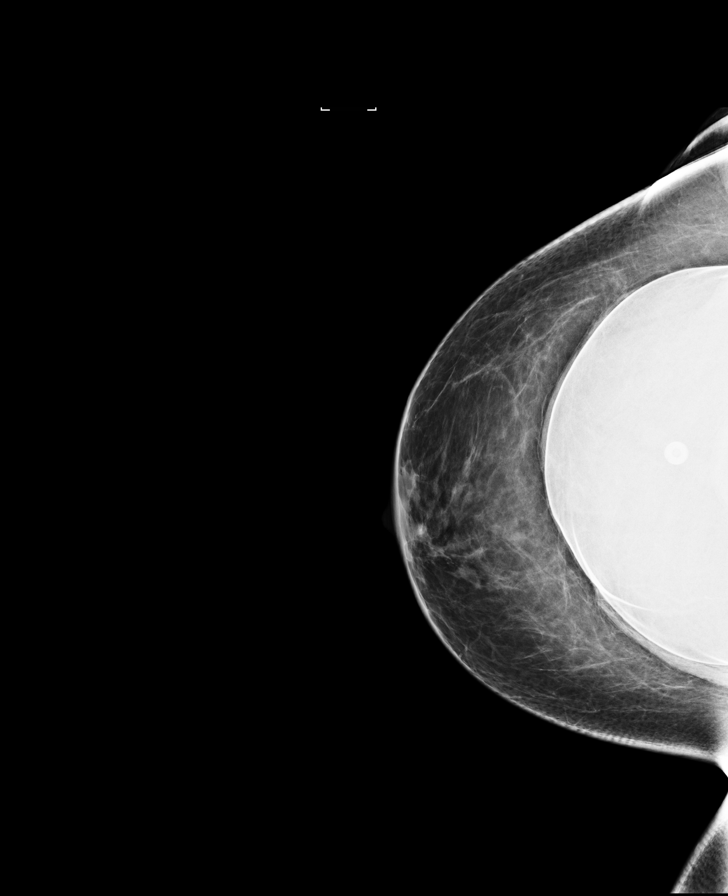

[L MLO (1 of 3)]
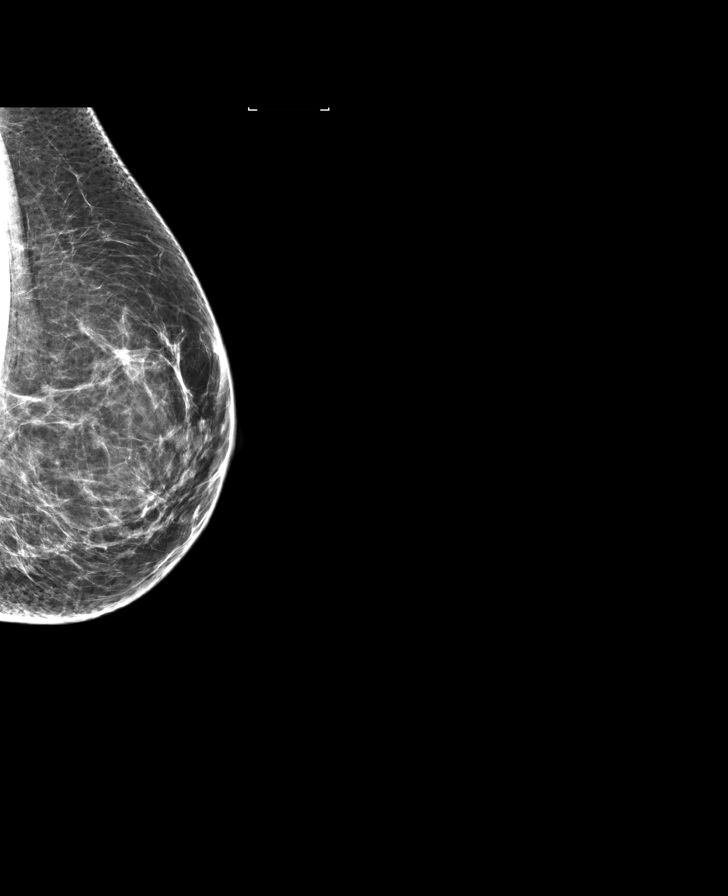

[L CC]
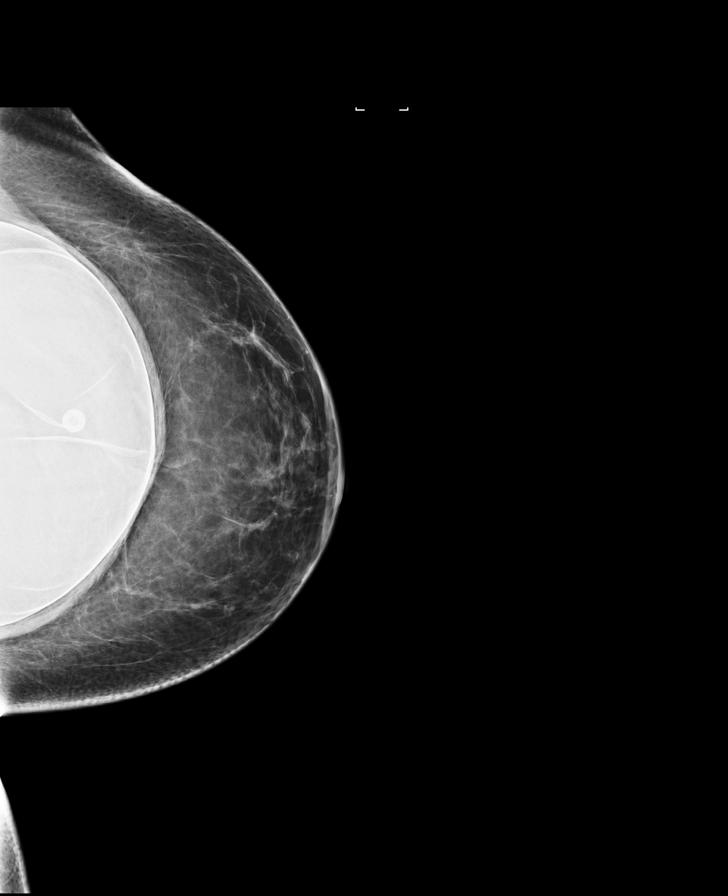

[R MLO (1 of 2)]
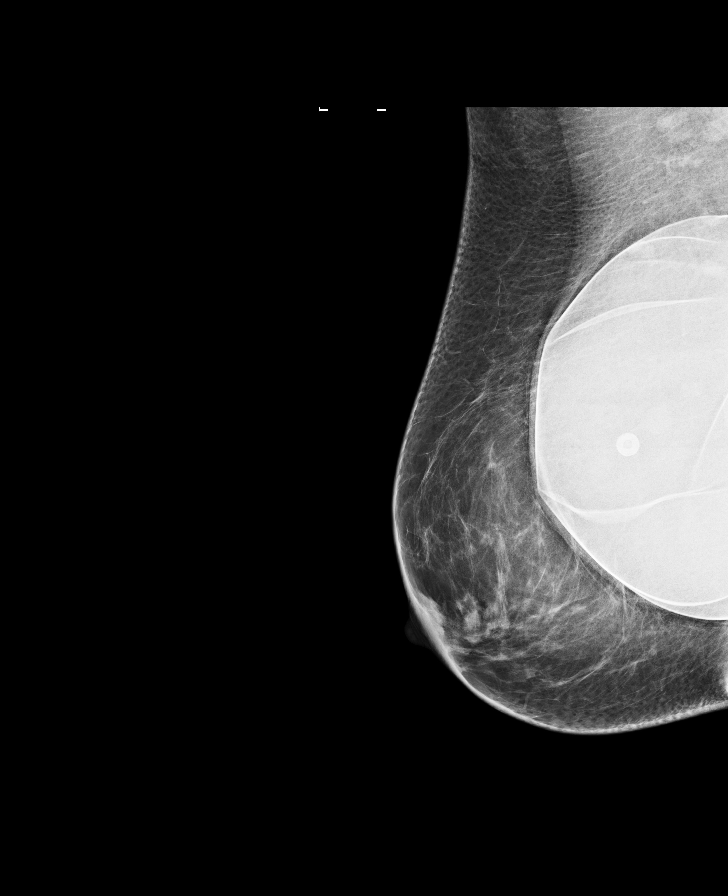

[L MLO (2 of 3)]
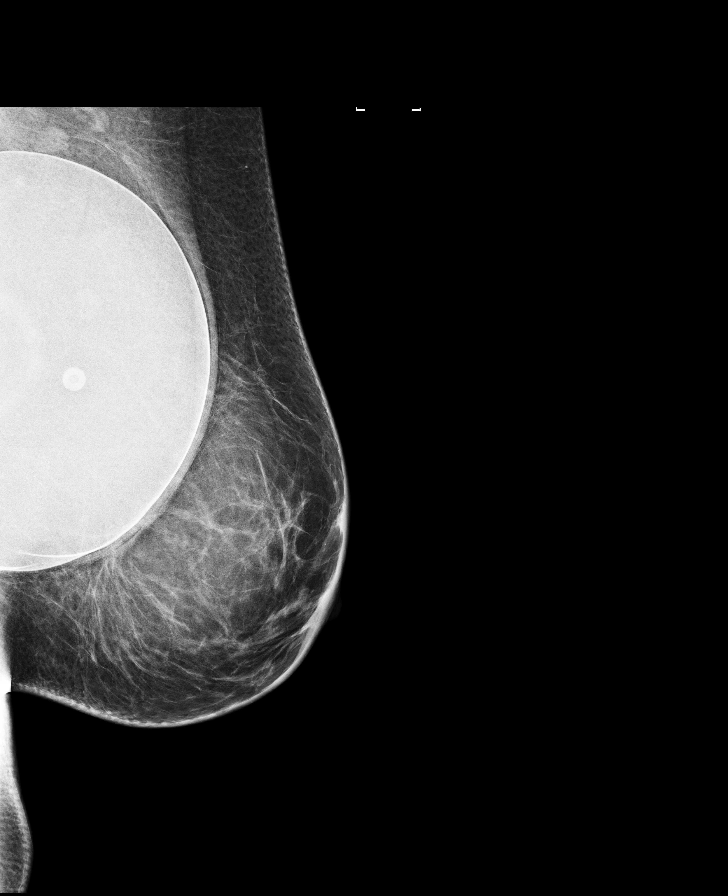

[L MLO (3 of 3)]
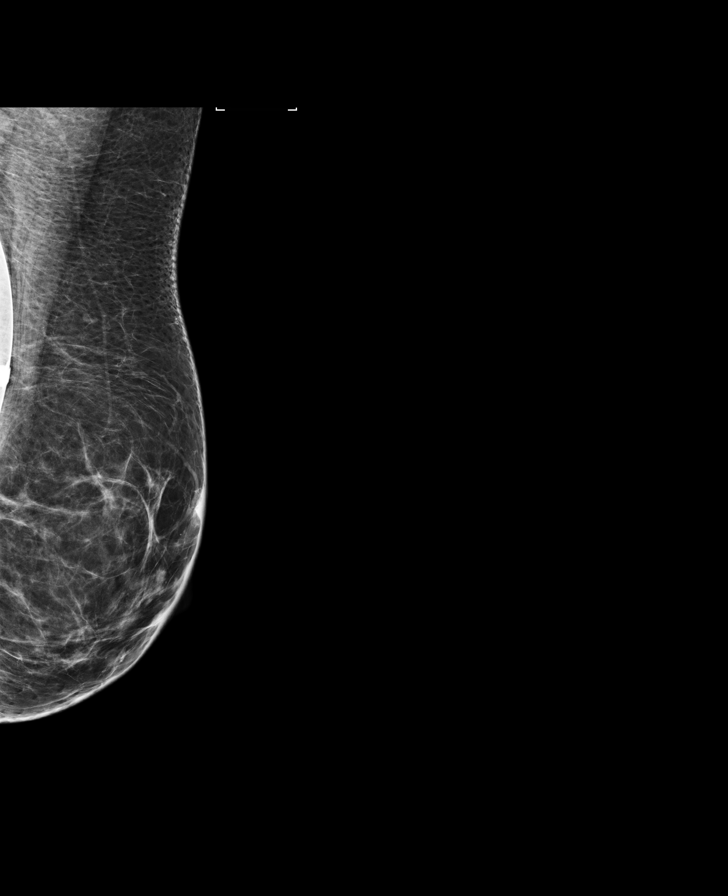

[R MLO (2 of 2)]
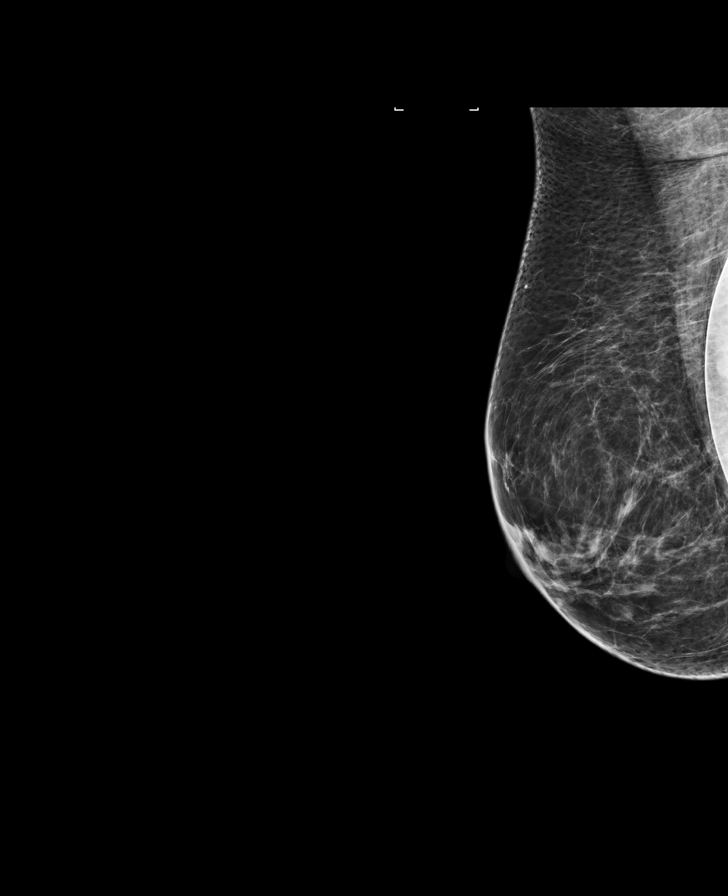

[L CC synth-2D]
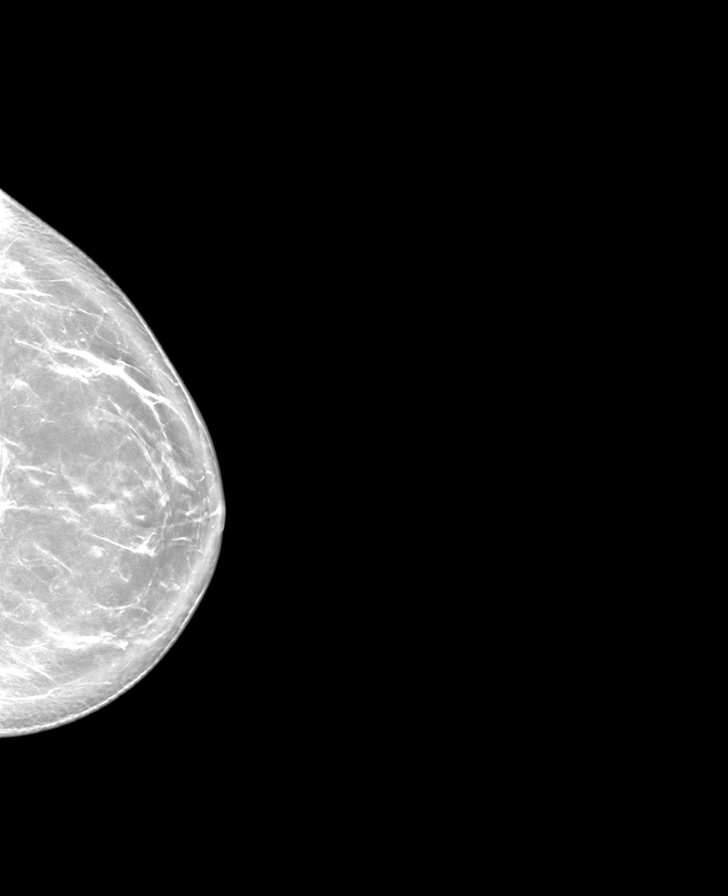

[8 of 33 positions shown; findings below may reference images not displayed]

ACR Breast Density Category b: There are scattered areas of
fibroglandular density.
FINDINGS: There are no findings suspicious for malignancy. Images were
processed with CAD.
IMPRESSION: No mammographic evidence of malignancy. A result letter of this
screening mammogram will be mailed directly to the patient.

RECOMMENDATION:
Screening mammogram in one year. (Code:H6-J-DBW)

BI-RADS CATEGORY  1:  Negative.

## 2019-03-02 ENCOUNTER — Ambulatory Visit (INDEPENDENT_AMBULATORY_CARE_PROVIDER_SITE_OTHER): Payer: Managed Care, Other (non HMO) | Admitting: Obstetrics and Gynecology

## 2019-03-02 ENCOUNTER — Encounter: Payer: Self-pay | Admitting: Obstetrics and Gynecology

## 2019-03-02 ENCOUNTER — Other Ambulatory Visit: Payer: Self-pay

## 2019-03-02 VITALS — BP 124/80 | HR 90 | Ht 65.0 in | Wt 151.0 lb

## 2019-03-02 DIAGNOSIS — Z01419 Encounter for gynecological examination (general) (routine) without abnormal findings: Secondary | ICD-10-CM

## 2019-03-02 DIAGNOSIS — Z1239 Encounter for other screening for malignant neoplasm of breast: Secondary | ICD-10-CM

## 2019-03-02 NOTE — Patient Instructions (Signed)
[facility] [address] [place] [address]  [facility] [place]  [address] [place]  [address]  Phone: [phone]  

## 2019-03-02 NOTE — Progress Notes (Signed)
Gynecology Annual Exam  PCP: Mar Daring, PA-C  Chief Complaint:  Chief Complaint  Patient presents with  . Gynecologic Exam    History of Present Illness: Patient is a 44 y.o. G0P0000 presents for annual exam. The patient has no complaints today.   LMP: Patient's last menstrual period was 11/17/2015 (exact date).  The patient is sexually active. She currently uses status post hysterectomy for contraception. She denies dyspareunia.  The patient does perform self breast exams.  There is no notable family history of breast or ovarian cancer in her family.  The patient wears seatbelts: yes.   The patient has regular exercise: yes.    The patient denies current symptoms of depression.    Review of Systems: Review of Systems  Constitutional: Negative for chills and fever.  HENT: Negative for congestion.   Respiratory: Negative for cough and shortness of breath.   Cardiovascular: Negative for chest pain and palpitations.  Gastrointestinal: Negative for abdominal pain, constipation, diarrhea, heartburn, nausea and vomiting.  Genitourinary: Negative for dysuria, frequency and urgency.  Skin: Negative for itching and rash.  Neurological: Negative for dizziness and headaches.  Endo/Heme/Allergies: Negative for polydipsia.  Psychiatric/Behavioral: Negative for depression.    Past Medical History:  Past Medical History:  Diagnosis Date  . Anemia    H/O YEARS AGO WITH HEAVY PERIODS-NO PROBLEM SINCE   . Anxiety   . Complication of anesthesia    HARD TO WAKE UP AFTER BREAST SURGERY  . GERD (gastroesophageal reflux disease)   . Heart murmur    AS A CHILD  . Migraine    MIGRAINES associated with menstrual cycles  . PONV (postoperative nausea and vomiting)     Past Surgical History:  Past Surgical History:  Procedure Laterality Date  . ABDOMINAL HYSTERECTOMY    . AUGMENTATION MAMMAPLASTY Bilateral 2008  . BREAST ENHANCEMENT SURGERY Left   . BREAST REDUCTION SURGERY   2008  . CYSTOSCOPY N/A 11/22/2015   Procedure: CYSTOSCOPY;  Surgeon: Malachy Mood, MD;  Location: [facility];  Service: Gynecology;  Laterality: N/A;  . HYSTEROSCOPY WITH D & C N/A 10/20/2015   Procedure: DILATATION AND CURETTAGE /HYSTEROSCOPY;  Surgeon: Malachy Mood, MD;  Location: [facility];  Service: Gynecology;  Laterality: N/A;  . LAPAROSCOPIC BILATERAL SALPINGECTOMY Bilateral 11/22/2015   Procedure: LAPAROSCOPIC BILATERAL SALPINGECTOMY;  Surgeon: Malachy Mood, MD;  Location: [facility];  Service: Gynecology;  Laterality: Bilateral;  . LAPAROSCOPIC HYSTERECTOMY N/A 11/22/2015   Procedure: HYSTERECTOMY TOTAL LAPAROSCOPIC;  Surgeon: Malachy Mood, MD;  Location: [facility];  Service: Gynecology;  Laterality: N/A;  . LASIK Bilateral 2006  . WISDOM TOOTH EXTRACTION      Gynecologic History:  Patient's last menstrual period was 11/17/2015 (exact date). Contraception: status post hysterectomy Last Pap: Results were: N/A s/p hysterectomy 11/22/2015 Last mammogram: 07/18/2018 Results were: benign cyst on diagnostic with BI-RAD III read on 01/09/2018  Obstetric History: G0P0000  Family History:  Family History  Problem Relation Age of Onset  . Colon cancer Maternal Grandfather   . Breast cancer Neg Hx     Social History:  Social History   Socioeconomic History  . Marital status: Married    Spouse name: Not on file  . Number of children: Not on file  . Years of education: Not on file  . Highest education level: Not on file  Occupational History  . Not on file  Tobacco Use  . Smoking status: Never Smoker  . Smokeless tobacco: Never Used  Substance and Sexual  Activity  . Alcohol use: Yes    Comment: socially  . Drug use: No  . Sexual activity: Yes    Birth control/protection: Surgical  Other Topics Concern  . Not on file  Social History Narrative  . Not on file   Social Determinants of Health   Financial Resource Strain:   . Difficulty of Paying Living Expenses:  Not on file  Food Insecurity:   . Worried About Charity fundraiser in the Last Year: Not on file  . Ran Out of Food in the Last Year: Not on file  Transportation Needs:   . Lack of Transportation (Medical): Not on file  . Lack of Transportation (Non-Medical): Not on file  Physical Activity:   . Days of Exercise per Week: Not on file  . Minutes of Exercise per Session: Not on file  Stress:   . Feeling of Stress : Not on file  Social Connections:   . Frequency of Communication with Friends and Family: Not on file  . Frequency of Social Gatherings with Friends and Family: Not on file  . Attends Religious Services: Not on file  . Active Member of Clubs or Organizations: Not on file  . Attends Archivist Meetings: Not on file  . Marital Status: Not on file  Intimate Partner Violence:   . Fear of Current or Ex-Partner: Not on file  . Emotionally Abused: Not on file  . Physically Abused: Not on file  . Sexually Abused: Not on file    Allergies:  Allergies  Allergen Reactions  . Other Rash    CATS, DOGS AND HORSES causes difficulty breathing if around her face  . Sulfa Antibiotics Rash    Medications: Prior to Admission medications   Medication Sig Start Date End Date Taking? Authorizing Provider  ALPRAZolam (XANAX) 0.5 MG tablet TAKE 1/2 TO 1 TABLET BY MOUTH EVERY NIGHT AT BEDTIME, THEN TAKE 1/2 TO 1 TABLET BY MOUTH EVERY 8 HOURS AS NEEDED FOR ANXIETY Patient taking differently: THEN TAKE 1/2 TO 1 TABLET BY MOUTH EVERY 8 HOURS AS NEEDED FOR ANXIETY OR SLEEP AT BEDTIME 09/14/15   Mar Daring, PA-C  cetirizine (ZYRTEC) 10 MG tablet Take 10 mg by mouth as needed for allergies.    [provider]  Dapsone (ACZONE EX) Apply 1 Dose topically daily.     [provider]  ibuprofen (ADVIL,MOTRIN) 600 MG tablet Take 1 tablet (600 mg total) by mouth every 6 (six) hours as needed. PAIN 11/23/15   Malachy Mood, MD  Multiple Vitamin (MULTI VITAMIN DAILY)  TABS Take 1 tablet by mouth daily.    [provider]  omeprazole (PRILOSEC) 20 MG capsule Take 20 mg by mouth daily as needed (heartburn).     [provider]  propranolol (INDERAL) 10 MG tablet Take 1 tablet (10 mg total) by mouth 3 (three) times daily as needed. Patient not taking: Reported on 02/26/2018 09/28/17   Mar Daring, PA-C    Physical Exam Vitals: Blood pressure 124/80, pulse 90, height 5\' 5"  (1.651 m), weight 151 lb (68.5 kg), last menstrual period 11/17/2015.  General: NAD HEENT: normocephalic, anicteric Thyroid: no enlargement, no palpable nodules Pulmonary: No increased work of breathing, CTAB Cardiovascular: RRR, distal pulses 2+ Breast: Breast symmetrical, no tenderness, no palpable nodules or masses, no skin or nipple retraction present, no nipple discharge.  No axillary or supraclavicular lymphadenopathy. Abdomen: NABS, soft, non-tender, non-distended.  Umbilicus without lesions.  No hepatomegaly, splenomegaly or masses palpable.  No evidence of hernia  Genitourinary:  External: Normal external female genitalia.  Normal urethral meatus, normal Bartholin's and Skene's glands.    Vagina: Normal vaginal mucosa, no evidence of prolapse.    Cervix: surgically absent  Uterus: surgically absent  Adnexa: ovaries non-enlarged, no adnexal masses  Rectal: deferred  Lymphatic: no evidence of inguinal lymphadenopathy Extremities: no edema, erythema, or tenderness Neurologic: Grossly intact Psychiatric: mood appropriate, affect full  Female chaperone present for pelvic and breast  portions of the physical exam    Assessment: 44 y.o. G0P0000 routine annual exam  Plan: Problem List Items Addressed This Visit    None    Visit Diagnoses    Encounter for gynecological examination without abnormal finding    -  Primary   Breast screening       Relevant Orders   MM 3D SCREEN BREAST BILATERAL      1) Mammogram - recommend yearly screening  mammogram.  Mammogram Was ordered today   2) STI screening  was notoffered and therefore not obtained  3) ASCCP guidelines and rational discussed.  Patient opts for discontinue secondary to prior hysterectomy screening interval  4) Contraception - the patient is currently using  status post hysterectomy.  She is not currently in need of contraception secondary to being sterile  5) Colonoscopy -- Screening recommended starting at age 37 for average risk individuals, age 18 for individuals deemed at increased risk (including African Americans) and recommended to continue until age 8.  For patient age 61-85 individualized approach is recommended.  Gold standard screening is via colonoscopy, Cologuard screening is an acceptable alternative for patient unwilling or unable to undergo colonoscopy.  "Colorectal cancer screening for average?risk adults: 2018 guideline update from the American Cancer Society"CA: A Cancer Journal for Clinicians: May 30, 2016   6) Routine healthcare maintenance including cholesterol, diabetes screening discussed managed by PCP  7) Return in about 1 year (around 03/01/2020) for annual.   Malachy Mood, MD, [facility], [facility] 03/02/2019, 4:13 PM

## 2019-12-02 DIAGNOSIS — D239 Other benign neoplasm of skin, unspecified: Secondary | ICD-10-CM

## 2019-12-02 HISTORY — DX: Other benign neoplasm of skin, unspecified: D23.9

## 2019-12-10 ENCOUNTER — Other Ambulatory Visit: Payer: Self-pay | Admitting: Dermatology

## 2020-01-05 ENCOUNTER — Ambulatory Visit: Payer: Managed Care, Other (non HMO) | Admitting: Dermatology

## 2020-01-05 ENCOUNTER — Other Ambulatory Visit: Payer: Self-pay

## 2020-01-05 ENCOUNTER — Encounter: Payer: Self-pay | Admitting: Dermatology

## 2020-01-05 VITALS — BP 118/79

## 2020-01-05 DIAGNOSIS — L814 Other melanin hyperpigmentation: Secondary | ICD-10-CM

## 2020-01-05 DIAGNOSIS — Z1283 Encounter for screening for malignant neoplasm of skin: Secondary | ICD-10-CM | POA: Diagnosis not present

## 2020-01-05 DIAGNOSIS — L821 Other seborrheic keratosis: Secondary | ICD-10-CM

## 2020-01-05 DIAGNOSIS — L719 Rosacea, unspecified: Secondary | ICD-10-CM

## 2020-01-05 DIAGNOSIS — Z86018 Personal history of other benign neoplasm: Secondary | ICD-10-CM | POA: Diagnosis not present

## 2020-01-05 DIAGNOSIS — D229 Melanocytic nevi, unspecified: Secondary | ICD-10-CM

## 2020-01-05 DIAGNOSIS — L578 Other skin changes due to chronic exposure to nonionizing radiation: Secondary | ICD-10-CM

## 2020-01-05 DIAGNOSIS — R21 Rash and other nonspecific skin eruption: Secondary | ICD-10-CM | POA: Diagnosis not present

## 2020-01-05 DIAGNOSIS — L700 Acne vulgaris: Secondary | ICD-10-CM

## 2020-01-05 DIAGNOSIS — D1800 Hemangioma unspecified site: Secondary | ICD-10-CM

## 2020-01-05 DIAGNOSIS — D225 Melanocytic nevi of trunk: Secondary | ICD-10-CM

## 2020-01-05 MED ORDER — IVERMECTIN 1 % EX CREA: TOPICAL_CREAM | CUTANEOUS | 3 refills | Status: DC

## 2020-01-05 MED ORDER — SPIRONOLACTONE 100 MG PO TABS: 100.0000 mg | ORAL_TABLET | Freq: Once | ORAL | 3 refills | Status: DC

## 2020-01-05 MED ORDER — SPIRONOLACTONE 100 MG PO TABS: 100.0000 mg | ORAL_TABLET | Freq: Every day | ORAL | 3 refills | Status: DC

## 2020-01-05 NOTE — Patient Instructions (Addendum)
Melanoma ABCDEs  Melanoma is the most dangerous type of skin cancer, and is the leading cause of death from skin disease.  You are more likely to develop melanoma if you:  Have light-colored skin, light-colored eyes, or red or blond hair  Spend a lot of time in the sun  Tan regularly, either outdoors or in a tanning bed  Have had blistering sunburns, especially during childhood  Have a close family member who has had a melanoma  Have atypical moles or large birthmarks  Early detection of melanoma is key since treatment is typically straightforward and cure rates are extremely high if we catch it early.   The first sign of melanoma is often a change in a mole or a new dark spot.  The ABCDE system is a way of remembering the signs of melanoma.  A for asymmetry:  The two halves do not match. B for border:  The edges of the growth are irregular. C for color:  A mixture of colors are present instead of an even brown color. D for diameter:  Melanomas are usually (but not always) greater than 59mm - the size of a pencil eraser. E for evolution:  The spot keeps changing in size, shape, and color.  Please check your skin once per month between visits. You can use a small mirror in front and a large mirror behind you to keep an eye on the back side or your body.   If you see any new or changing lesions before your next follow-up, please call to schedule a visit.  Please continue daily skin protection including broad spectrum sunscreen SPF 30+ to sun-exposed areas, reapplying every 2 hours as needed when you're outdoors.    Start Spironolactone 100mg  - For the first week start with 1/2 tablet daily. Spironolactone can cause increased urination and cause blood pressure to decrease. Please watch for signs of lightheadedness and be cautious when changing position. It can sometimes cause breast tenderness or an irregular period in premenopausal women. It can also increase potassium. The increase in  potassium usually is not a concern unless you are taking other medicines that also increase potassium, so please be sure your doctor knows all of the other medications you are taking. This medication should not be taken by pregnant women.  This medicine should also not be taken together with sulfa drugs like Bactrim (trimethoprim/sulfamethexazole).

## 2020-01-05 NOTE — Progress Notes (Signed)
Follow-Up Visit   Subjective  Judy Sims is a 45 y.o. female who presents for the following: Annual Exam. She started getting cystic acne at least monthly within the past year along the jawline, better today. She has rosacea and is controlled using Soolantra Cream. History of dysplastic nevus of the left posterior shoulder.  The following portions of the chart were reviewed this encounter and updated as appropriate:       Review of Systems:  No other skin or systemic complaints except as noted in HPI or Assessment and Plan.  Objective  Well appearing patient in no apparent distress; mood and affect are within normal limits.  A full examination was performed including scalp, head, eyes, ears, nose, lips, neck, chest, axillae, abdomen, back, buttocks, bilateral upper extremities, bilateral lower extremities, hands, feet, fingers, toes, fingernails, and toenails. All findings within normal limits unless otherwise noted below.  Objective  Left Posterior Shoulder: Scar with no evidence of recurrence.   Objective  face: Mild erythema on nose.  Objective  Sternum: Light pink papules.  Objective  Left Mid Upper Back: 3.0 x 2.20mm medium dark brown macule  Objective  bil jaw: Clear today.  BP 118/79   Assessment & Plan   Skin cancer screening performed today.  Actinic Damage - chronic, secondary to cumulative UV radiation exposure/sun exposure over time - diffuse scaly erythematous macules with underlying dyspigmentation - Recommend daily broad spectrum sunscreen SPF 30+ to sun-exposed areas, reapply every 2 hours as needed.  - Call for new or changing lesions. Lentigines - Scattered tan macules - Discussed due to sun exposure - Benign, observe - Recommend daily broad spectrum sunscreen SPF 30+ to sun-exposed areas, reapply every 2 hours as needed. - Call for any changes  Seborrheic Keratoses - Stuck-on, waxy, tan-brown papules and plaques  - Discussed benign  etiology and prognosis. - Observe - Call for any changes  Melanocytic Nevi - Tan-brown and/or pink-flesh-colored symmetric macules and papules - Benign appearing on exam today - Observation - Call clinic for new or changing moles - Recommend daily use of broad spectrum spf 30+ sunscreen to sun-exposed areas.  Hemangiomas - Red papules - Discussed benign nature - Observe - Call for any changes  History of dysplastic nevus Left Posterior Shoulder  Clear. Observe for recurrence. Call clinic for new or changing lesions.  Recommend regular skin exams, daily broad-spectrum spf 30+ sunscreen use, and photoprotection.     Rosacea face  Improving. Continue Soolantra Cream apply to face qhs. [facility].  Rosacea is a chronic progressive skin condition usually affecting the face of adults, causing redness and/or acne bumps. It is treatable but not curable. It sometimes affects the eyes (ocular rosacea) as well. It may respond to topical and/or systemic medication and can flare with stress, sun exposure, alcohol, exercise and some foods.  Daily application of broad spectrum spf 30+ sunscreen to face is recommended to reduce flares.    Rash Sternum  Just noticed this morning Start OTC Cortisone cream. If doesn't clear or worsens, patient can call for Rx.  Nevus Left Mid Upper Back  Benign-appearing.  Observation.  Call clinic for new or changing moles.  Recommend daily use of broad spectrum spf 30+ sunscreen to sun-exposed areas.    Cystic acne bil jaw  Clinical history c/w chronic cystic acne Start Spironolactone 100mg  take 1 po QD dsp #30 3Rf.  Spironolactone can cause increased urination and cause blood pressure to decrease. Please watch for signs of lightheadedness  and be cautious when changing position. It can sometimes cause breast tenderness or an irregular period in premenopausal women. It can also increase potassium. The increase in potassium usually is not a  concern unless you are taking other medicines that also increase potassium, so please be sure your doctor knows all of the other medications you are taking. This medication should not be taken by pregnant women.  This medicine should also not be taken together with sulfa drugs like Bactrim (trimethoprim/sulfamethexazole).   Patient has had a hysterectomy, still has ovaries.  Reordered Medications spironolactone (ALDACTONE) 100 MG tablet  Return in about 3 months (around 04/04/2020) for acne.  IJamesetta Sims, CMA, am acting as scribe for Judy Patty, MD .  Documentation: I have reviewed the above documentation for accuracy and completeness, and I agree with the above.  Judy Patty MD

## 2020-02-11 ENCOUNTER — Telehealth (INDEPENDENT_AMBULATORY_CARE_PROVIDER_SITE_OTHER): Payer: Managed Care, Other (non HMO) | Admitting: Physician Assistant

## 2020-02-11 ENCOUNTER — Encounter: Payer: Self-pay | Admitting: Physician Assistant

## 2020-02-11 DIAGNOSIS — U071 COVID-19: Secondary | ICD-10-CM | POA: Diagnosis not present

## 2020-02-11 MED ORDER — PREDNISONE 10 MG (21) PO TBPK: ORAL_TABLET | ORAL | 0 refills | Status: DC

## 2020-02-11 NOTE — Progress Notes (Signed)
MyChart Video Visit    Virtual Visit via Video Note   This visit type was conducted due to national recommendations for restrictions regarding the COVID-19 Pandemic (e.g. social distancing) in an effort to limit this patient's exposure and mitigate transmission in our community. This patient is at least at moderate risk for complications without adequate follow up. This format is felt to be most appropriate for this patient at this time. Physical exam was limited by quality of the video and audio technology used for the visit.   Patient location: Home Provider location: [facility]  I discussed the limitations of evaluation and management by telemedicine and the availability of in person appointments. The patient expressed understanding and agreed to proceed.  Patient: Judy Sims   DOB: [birth year]   45 y.o. Female  MRN: [id number] Visit Date: 02/11/2020  Today's healthcare provider: Mar Daring, PA-C   Chief Complaint  Patient presents with  . Covid Exposure   Subjective    URI  This is a new problem. The current episode started 1 to 4 weeks ago (wednesday,02/03/20). The problem has been unchanged. There has been no fever. Associated symptoms include congestion, coughing, diarrhea, headaches and a sore throat. Pertinent negatives include no abdominal pain, ear pain, nausea, rhinorrhea, sinus pain, swollen glands, vomiting or wheezing. She has tried acetaminophen, increased fluids, decongestant, antihistamine, sleep and NSAIDs for the symptoms. The treatment provided moderate relief.    Patient's husband tested positive for Covid 19 on 02/01/20. Her symptoms started Wednesday, 02/03/20. She works from home and is pretty isolated so did not feel she needed to be officially tested. She has had sinus congestion, scratchy throat, swollen salivary ducts (started on Tuesday) and diarrhea (started last night).    Patient Active Problem List   Diagnosis Date Noted  .  S/P laparoscopic hysterectomy 11/22/2015  . Migraines 12/06/2014   Past Medical History:  Diagnosis Date  . Anemia    H/O YEARS AGO WITH HEAVY PERIODS-NO PROBLEM SINCE   . Anxiety   . Complication of anesthesia    HARD TO WAKE UP AFTER BREAST SURGERY  . Dysplastic nevus 12/2019   left posterior shoulder, mild  . GERD (gastroesophageal reflux disease)   . Heart murmur    AS A CHILD  . Migraine    MIGRAINES associated with menstrual cycles  . PONV (postoperative nausea and vomiting)    Social History   Tobacco Use  . Smoking status: Never Smoker  . Smokeless tobacco: Never Used  Vaping Use  . Vaping Use: Never used  Substance Use Topics  . Alcohol use: Yes    Comment: socially  . Drug use: No   Allergies  Allergen Reactions  . Other Rash    CATS, DOGS AND HORSES causes difficulty breathing if around her face  . Sulfa Antibiotics Rash    Medications: Outpatient Medications Prior to Visit  Medication Sig  . ALPRAZolam (XANAX) 0.5 MG tablet TAKE 1/2 TO 1 TABLET BY MOUTH EVERY NIGHT AT BEDTIME, THEN TAKE 1/2 TO 1 TABLET BY MOUTH EVERY 8 HOURS AS NEEDED FOR ANXIETY  . cetirizine (ZYRTEC) 10 MG tablet Take 10 mg by mouth as needed for allergies.  . Ivermectin (SOOLANTRA) 1 % CREA APPLY A THIN COAT TO THE ENTIRE FACE EVERY NIGHT AT BEDTIME  . Multiple Vitamin (MULTI VITAMIN DAILY) TABS Take 1 tablet by mouth daily.  Marland Kitchen spironolactone (ALDACTONE) 100 MG tablet Take 1 tablet (100 mg total) by mouth daily.  Marland Kitchen  ibuprofen (ADVIL,MOTRIN) 600 MG tablet Take 1 tablet (600 mg total) by mouth every 6 (six) hours as needed. PAIN  . omeprazole (PRILOSEC) 20 MG capsule Take 20 mg by mouth daily as needed (heartburn).    No facility-administered medications prior to visit.    Review of Systems  Constitutional: Positive for fatigue. Negative for activity change, appetite change, chills, diaphoresis, fever and unexpected weight change.  HENT: Positive for congestion, postnasal drip and  sore throat. Negative for ear discharge, ear pain, rhinorrhea, sinus pressure and sinus pain.   Eyes: Negative.   Respiratory: Positive for cough. Negative for apnea, choking, chest tightness, shortness of breath, wheezing and stridor.   Gastrointestinal: Positive for diarrhea. Negative for abdominal distention, abdominal pain, anal bleeding, blood in stool, constipation, nausea, rectal pain and vomiting.  Musculoskeletal: Positive for myalgias.  Neurological: Positive for headaches. Negative for dizziness and light-headedness.      Objective    LMP 11/17/2015 (Exact Date)    Physical Exam Vitals reviewed.  Constitutional:      General: She is not in acute distress.    Appearance: Normal appearance. She is well-developed, normal weight and well-nourished. She is not ill-appearing.  HENT:     Head: Normocephalic and atraumatic.     Mouth/Throat:     Mouth: Mucous membranes are moist.     Tongue: No lesions.     Pharynx: Oropharynx is clear. Uvula midline. No oropharyngeal exudate or posterior oropharyngeal erythema.     Comments: Salivary ducts under the tongue are swollen Eyes:     Extraocular Movements: EOM normal.  Pulmonary:     Effort: Pulmonary effort is normal. No respiratory distress.  Musculoskeletal:     Cervical back: Normal range of motion and neck supple.  Neurological:     Mental Status: She is alert.  Psychiatric:        Mood and Affect: Mood and affect normal.        Behavior: Behavior normal.        Thought Content: Thought content normal.        Judgment: Judgment normal.        Assessment & Plan     1. COVID-19 Overall doing well, continue symptomatic management. Will add prednisone as below for the swollen salivary ducts. Continue to push fluids. Salt water gargles may help inflammation as well. Call if not improving or if symptoms change or worsen.  - predniSONE (STERAPRED UNI-PAK 21 TAB) 10 MG (21) TBPK tablet; 6 day taper; take as directed on  package instructions  Dispense: 21 tablet; Refill: 0   No follow-ups on file.     I discussed the assessment and treatment plan with the patient. The patient was provided an opportunity to ask questions and all were answered. The patient agreed with the plan and demonstrated an understanding of the instructions.   The patient was advised to call back or seek an in-person evaluation if the symptoms worsen or if the condition fails to improve as anticipated.  I provided 12 minutes of face-to-face time during this encounter via MyChart Video enabled encounter.  Reynolds Bowl, PA-C, have reviewed all documentation for this visit. The documentation on 02/11/20 for the exam, diagnosis, procedures, and orders are all accurate and complete.   [facility] [phone] (phone) [phone] (fax)  [place]

## 2020-03-04 ENCOUNTER — Other Ambulatory Visit: Payer: Self-pay

## 2020-03-04 ENCOUNTER — Encounter: Payer: Self-pay | Admitting: Obstetrics and Gynecology

## 2020-03-04 ENCOUNTER — Ambulatory Visit (INDEPENDENT_AMBULATORY_CARE_PROVIDER_SITE_OTHER): Payer: Managed Care, Other (non HMO) | Admitting: Obstetrics and Gynecology

## 2020-03-04 VITALS — BP 98/62 | Ht 65.0 in | Wt 134.0 lb

## 2020-03-04 DIAGNOSIS — Z1239 Encounter for other screening for malignant neoplasm of breast: Secondary | ICD-10-CM

## 2020-03-04 DIAGNOSIS — Z1211 Encounter for screening for malignant neoplasm of colon: Secondary | ICD-10-CM | POA: Diagnosis not present

## 2020-03-04 DIAGNOSIS — Z01419 Encounter for gynecological examination (general) (routine) without abnormal findings: Secondary | ICD-10-CM | POA: Diagnosis not present

## 2020-03-04 DIAGNOSIS — Z1231 Encounter for screening mammogram for malignant neoplasm of breast: Secondary | ICD-10-CM

## 2020-03-04 NOTE — Patient Instructions (Signed)
[facility] [address] [place] [address]  [facility] [place]  [address] [place] [address]  Phone: [phone]  

## 2020-03-04 NOTE — Progress Notes (Signed)
Gynecology Annual Exam  PCP: Mar Daring, PA-C  Chief Complaint:  Chief Complaint  Patient presents with  . Gynecologic Exam    Annual - no concerns. RM 5    History of Present Illness: Patient is a 45 y.o. G0P0000 presents for annual exam. The patient has no complaints today.   LMP: Patient's last menstrual period was 11/17/2015 (exact date).  The patient is sexually active. She currently uses status post hysterectomy for contraception. She denies dyspareunia.  The patient does perform self breast exams.  There is no notable family history of breast or ovarian cancer in her family.  The patient wears seatbelts: yes.   The patient has regular exercise: not asked.    The patient denies current symptoms of depression.    Review of Systems: Review of Systems  Constitutional: Negative for chills and fever.  HENT: Negative for congestion.   Respiratory: Negative for cough and shortness of breath.   Cardiovascular: Negative for chest pain and palpitations.  Gastrointestinal: Negative for abdominal pain, constipation, diarrhea, heartburn, nausea and vomiting.  Genitourinary: Negative for dysuria, frequency and urgency.  Skin: Negative for itching and rash.  Neurological: Negative for dizziness and headaches.  Endo/Heme/Allergies: Negative for polydipsia.  Psychiatric/Behavioral: Negative for depression.    Past Medical History:  Patient Active Problem List   Diagnosis Date Noted  . S/P laparoscopic hysterectomy 11/22/2015  . Migraines 12/06/2014    Past Surgical History:  Past Surgical History:  Procedure Laterality Date  . ABDOMINAL HYSTERECTOMY    . AUGMENTATION MAMMAPLASTY Bilateral 2008  . BREAST ENHANCEMENT SURGERY Left   . BREAST REDUCTION SURGERY  2008  . CYSTOSCOPY N/A 11/22/2015   Procedure: CYSTOSCOPY;  Surgeon: Malachy Mood, MD;  Location: [facility];  Service: Gynecology;  Laterality: N/A;  . HYSTEROSCOPY WITH D & C N/A 10/20/2015   Procedure:  DILATATION AND CURETTAGE /HYSTEROSCOPY;  Surgeon: Malachy Mood, MD;  Location: [facility];  Service: Gynecology;  Laterality: N/A;  . LAPAROSCOPIC BILATERAL SALPINGECTOMY Bilateral 11/22/2015   Procedure: LAPAROSCOPIC BILATERAL SALPINGECTOMY;  Surgeon: Malachy Mood, MD;  Location: [facility];  Service: Gynecology;  Laterality: Bilateral;  . LAPAROSCOPIC HYSTERECTOMY N/A 11/22/2015   Procedure: HYSTERECTOMY TOTAL LAPAROSCOPIC;  Surgeon: Malachy Mood, MD;  Location: [facility];  Service: Gynecology;  Laterality: N/A;  . LASIK Bilateral 2006  . WISDOM TOOTH EXTRACTION      Gynecologic History:  Patient's last menstrual period was 11/17/2015 (exact date). Contraception: status post hysterectomy Last Pap: Results were: discontinue secondary to prior hysterectomy  Last mammogram: 07/18/2018 Results were: BI-RAD II  Obstetric History: G0P0000  Family History:  Family History  Problem Relation Age of Onset  . Colon cancer Maternal Grandfather   . Diabetes Brother   . Diabetes Maternal Uncle   . Breast cancer Neg Hx     Social History:  Social History   Socioeconomic History  . Marital status: Married    Spouse name: Not on file  . Number of children: Not on file  . Years of education: Not on file  . Highest education level: Not on file  Occupational History  . Not on file  Tobacco Use  . Smoking status: Never Smoker  . Smokeless tobacco: Never Used  Vaping Use  . Vaping Use: Never used  Substance and Sexual Activity  . Alcohol use: Yes    Comment: socially  . Drug use: No  . Sexual activity: Yes    Birth control/protection: Surgical  Other Topics Concern  .  Not on file  Social History Narrative  . Not on file   Social Determinants of Health   Financial Resource Strain: Not on file  Food Insecurity: Not on file  Transportation Needs: Not on file  Physical Activity: Not on file  Stress: Not on file  Social Connections: Not on file  Intimate Partner Violence:  Not on file    Allergies:  Allergies  Allergen Reactions  . Other Rash    CATS, DOGS AND HORSES causes difficulty breathing if around her face  . Sulfa Antibiotics Rash    Medications: Prior to Admission medications   Medication Sig Start Date End Date Taking? Authorizing Provider  cetirizine (ZYRTEC) 10 MG tablet Take 10 mg by mouth as needed for allergies.   Yes [provider]  Ivermectin (SOOLANTRA) 1 % CREA APPLY A THIN COAT TO THE ENTIRE FACE EVERY NIGHT AT BEDTIME 01/05/20  Yes Brendolyn Patty, MD  Multiple Vitamin (MULTI VITAMIN DAILY) TABS Take 1 tablet by mouth daily.   Yes [provider]  spironolactone (ALDACTONE) 100 MG tablet Take 1 tablet (100 mg total) by mouth daily. 01/05/20  Yes Brendolyn Patty, MD  ALPRAZolam Duanne Moron) 0.5 MG tablet TAKE 1/2 TO 1 TABLET BY MOUTH EVERY NIGHT AT BEDTIME, THEN TAKE 1/2 TO 1 TABLET BY MOUTH EVERY 8 HOURS AS NEEDED FOR ANXIETY Patient not taking: Reported on 03/04/2020 09/14/15   Mar Daring, PA-C  ibuprofen (ADVIL,MOTRIN) 600 MG tablet Take 1 tablet (600 mg total) by mouth every 6 (six) hours as needed. PAIN 11/23/15   Malachy Mood, MD  omeprazole (PRILOSEC) 20 MG capsule Take 20 mg by mouth daily as needed (heartburn).     [provider]  predniSONE (STERAPRED UNI-PAK 21 TAB) 10 MG (21) TBPK tablet 6 day taper; take as directed on package instructions Patient not taking: Reported on 03/04/2020 02/11/20   Mar Daring, PA-C    Physical Exam Vitals: Blood pressure 98/62, height 5\' 5"  (1.651 m), weight 134 lb (60.8 kg), last menstrual period 11/17/2015.  General: NAD HEENT: normocephalic, anicteric Thyroid: no enlargement, no palpable nodules Pulmonary: No increased work of breathing, CTAB Cardiovascular: RRR, distal pulses 2+ Breast: Breast symmetrical, no tenderness, no palpable nodules or masses, no skin or nipple retraction present, no nipple discharge.  No axillary or supraclavicular  lymphadenopathy. Abdomen: NABS, soft, non-tender, non-distended.  Umbilicus without lesions.  No hepatomegaly, splenomegaly or masses palpable. No evidence of hernia  Genitourinary:  External: Normal external female genitalia.  Normal urethral meatus, normal Bartholin's and Skene's glands.    Vagina: Normal vaginal mucosa, no evidence of prolapse.    Cervix: surgically absent  Uterus: surgically absent  Adnexa: ovaries non-enlarged, no adnexal masses  Rectal: deferred  Lymphatic: no evidence of inguinal lymphadenopathy Extremities: no edema, erythema, or tenderness Neurologic: Grossly intact Psychiatric: mood appropriate, affect full  Female chaperone present for pelvic and breast  portions of the physical exam    Assessment: 45 y.o. G0P0000 routine annual exam  Plan: Problem List Items Addressed This Visit   None   Visit Diagnoses    Encounter for gynecological examination without abnormal finding    -  Primary   Breast screening       Breast cancer screening by mammogram       Relevant Orders   MM 3D SCREEN BREAST BILATERAL   Colon cancer screening       Relevant Orders   Ambulatory referral to Gastroenterology      1) Mammogram -  recommend yearly screening mammogram.  Mammogram Was ordered today   2) STI screening  was notoffered and therefore not obtained  3) ASCCP guidelines and rational discussed.  Patient opts for discontinue secondary to prior hysterectomy screening interval  4) Contraception - the patient is currently using  status post hysterectomy.  She is not currently in need of contraception secondary to being sterile  5) Colonoscopy -- Screening recommended starting at age 82 for average risk individuals, age 56 for individuals deemed at increased risk (including African Americans) and recommended to continue until age 70.  For patient age 87-85 individualized approach is recommended.  Gold standard screening is via colonoscopy, Cologuard screening is an  acceptable alternative for patient unwilling or unable to undergo colonoscopy.  "Colorectal cancer screening for average?risk adults: 2018 guideline update from the American Cancer Society"CA: A Cancer Journal for Clinicians: May 30, 2016   6) Routine healthcare maintenance including cholesterol, diabetes screening discussed managed by PCP  7) Return in about 1 year (around 03/04/2021) for annual.   Malachy Mood, MD, [facility], [place] 03/04/2020, 3:49 PM

## 2020-03-10 ENCOUNTER — Other Ambulatory Visit: Payer: Self-pay

## 2020-03-10 ENCOUNTER — Telehealth (INDEPENDENT_AMBULATORY_CARE_PROVIDER_SITE_OTHER): Payer: Self-pay | Admitting: Gastroenterology

## 2020-03-10 DIAGNOSIS — Z1211 Encounter for screening for malignant neoplasm of colon: Secondary | ICD-10-CM

## 2020-03-10 MED ORDER — PEG 3350-KCL-NA BICARB-NACL 420 G PO SOLR: 4000.0000 mL | Freq: Once | ORAL | 0 refills | Status: AC

## 2020-03-10 NOTE — Progress Notes (Signed)
Gastroenterology Pre-Procedure Review  Request Date: Friday 05/20/20 Requesting Physician: Dr. Allen Norris  PATIENT REVIEW QUESTIONS: The patient responded to the following health history questions as indicated:    1. Are you having any GI issues? no 2. Do you have a personal history of Polyps? no 3. Do you have a family history of Colon Cancer or Polyps? yes (maternal grandfather colon cancer, mother colon polyps) 4. Diabetes Mellitus? no 5. Joint replacements in the past 12 months?no 6. Major health problems in the past 3 months?no 7. Any artificial heart valves, MVP, or defibrillator?no    MEDICATIONS & ALLERGIES:    Patient reports the following regarding taking any anticoagulation/antiplatelet therapy:   Plavix, Coumadin, Eliquis, Xarelto, Lovenox, Pradaxa, Brilinta, or Effient? no Aspirin? no  Patient confirms/reports the following medications:  Current Outpatient Medications  Medication Sig Dispense Refill  . Ascorbic Acid (VITAMIN C PO) Take by mouth.    . cetirizine (ZYRTEC) 10 MG tablet Take 10 mg by mouth as needed for allergies.    . COLLAGEN PO Take by mouth.    . Ivermectin (SOOLANTRA) 1 % CREA APPLY A THIN COAT TO THE ENTIRE FACE EVERY NIGHT AT BEDTIME 45 g 3  . Multiple Vitamin (MULTI VITAMIN DAILY) TABS Take 1 tablet by mouth daily.    . Multiple Vitamins-Minerals (ZINC PO) Take by mouth.    . spironolactone (ALDACTONE) 100 MG tablet Take 1 tablet (100 mg total) by mouth daily. 30 tablet 3   No current facility-administered medications for this visit.    Patient confirms/reports the following allergies:  Allergies  Allergen Reactions  . Other Rash    CATS, DOGS AND HORSES causes difficulty breathing if around her face  . Sulfa Antibiotics Rash    No orders of the defined types were placed in this encounter.   AUTHORIZATION INFORMATION Primary Insurance: 1D#: Group #:  Secondary Insurance: 1D#: Group #:  SCHEDULE INFORMATION: Date: Friday  05/20/20 Time: Location:[facility]

## 2020-04-04 ENCOUNTER — Ambulatory Visit
Admission: RE | Admit: 2020-04-04 | Discharge: 2020-04-04 | Disposition: A | Discharge status: Home / Self Care | Payer: Managed Care, Other (non HMO) | Source: Ambulatory Visit | Attending: Obstetrics and Gynecology | Admitting: Obstetrics and Gynecology

## 2020-04-04 ENCOUNTER — Ambulatory Visit: Payer: Managed Care, Other (non HMO) | Admitting: Dermatology

## 2020-04-04 ENCOUNTER — Other Ambulatory Visit: Payer: Self-pay

## 2020-04-04 DIAGNOSIS — L719 Rosacea, unspecified: Secondary | ICD-10-CM | POA: Diagnosis not present

## 2020-04-04 DIAGNOSIS — L700 Acne vulgaris: Secondary | ICD-10-CM | POA: Diagnosis not present

## 2020-04-04 DIAGNOSIS — Z1231 Encounter for screening mammogram for malignant neoplasm of breast: Secondary | ICD-10-CM | POA: Insufficient documentation

## 2020-04-04 MED ORDER — WINLEVI 1 % EX CREA: 1.0000 "application " | TOPICAL_CREAM | CUTANEOUS | 4 refills | Status: DC

## 2020-04-04 NOTE — Progress Notes (Signed)
   Follow-Up Visit   Subjective  Judy Sims is a 45 y.o. female who presents for the following: Acne (Face, 18m f/u Spironolactone 100mg  1/2 po qd, only taking 1/2 bc blood pressure dropped).  She is tolerating it fine now and thinks it has help prevent the cystic acne.  She also has rosacea and it is doing well using Soolantra cream.   The following portions of the chart were reviewed this encounter and updated as appropriate:       Review of Systems:  No other skin or systemic complaints except as noted in HPI or Assessment and Plan.  Objective  Well appearing patient in no apparent distress; mood and affect are within normal limits.  A focused examination was performed including face. Relevant physical exam findings are noted in the Assessment and Plan.  Objective  face: Resolving inflammatory paps inferior chin, R chin  BP today 103/66  Objective  face: Mild erythema nose and malar cheeks, otherwise clear   Assessment & Plan  Acne vulgaris face  Chronic Cystic Acne (pt has had a hysterectomy but has ovaries)    Cont Spironolactone 100mg  1/2 po qd Start Winlevi cr qd/bid sample x 1 [id number] 12/2020  Spironolactone can cause increased urination and cause blood pressure to decrease. Please watch for signs of lightheadedness and be cautious when changing position. It can sometimes cause breast tenderness or an irregular period in premenopausal women. It can also increase potassium. The increase in potassium usually is not a concern unless you are taking other medicines that also increase potassium, so please be sure your doctor knows all of the other medications you are taking. This medication should not be taken by pregnant women.  This medicine should also not be taken together with sulfa drugs like Bactrim (trimethoprim/sulfamethexazole).    Clascoterone (WINLEVI) 1 % CREA - face  Rosacea face  Improved  Rosacea is a chronic progressive skin condition usually  affecting the face of adults, causing redness and/or acne bumps. It is treatable but not curable. It sometimes affects the eyes (ocular rosacea) as well. It may respond to topical and/or systemic medication and can flare with stress, sun exposure, alcohol, exercise and some foods.  Daily application of broad spectrum spf 30+ sunscreen to face is recommended to reduce flares.  Cont Soolantra cream qhs   Return in about 3 months (around 07/04/2020) for Acne f/u, Rosacea f/u.   I, Othelia Pulling, RMA, am acting as scribe for Brendolyn Patty, MD . Documentation: I have reviewed the above documentation for accuracy and completeness, and I agree with the above.  Brendolyn Patty MD

## 2020-04-04 NOTE — Patient Instructions (Addendum)
If you have any questions or concerns for your doctor, please call our main line at [phone] and press option 4 to reach your doctor's medical assistant. If no one answers, please leave a voicemail as directed and we will return your call as soon as possible. Messages left after 4 pm will be answered the following business day.   You may also send [place] a message via [place]. We typically respond to MyChart messages within 1-2 business days.  For prescription refills, please ask your pharmacy to contact our office. Our fax number is [phone].  If you have an urgent issue when the clinic is closed that cannot wait until the next business day, you can page your doctor at the number below.    Please note that while we do our best to be available for urgent issues outside of office hours, we are not available 24/7.   If you have an urgent issue and are unable to reach [place], you may choose to seek medical care at your doctor's office, retail clinic, urgent care center, or emergency room.  If you have a medical emergency, please immediately call 911 or go to the emergency department.  Pager Numbers  - Dr. Nehemiah Massed: [phone]  - Dr. Laurence Ferrari: [phone]  - Dr. Nicole Kindred: [phone]  In the event of inclement weather, please call our main line at [phone] for an update on the status of any delays or closures.  Dermatology Medication Tips: Please keep the boxes that topical medications come in in order to help keep track of the instructions about where and how to use these. Pharmacies typically print the medication instructions only on the boxes and not directly on the medication tubes.   If your medication is too expensive, please contact our office at [phone] option 4 or send [place] a message through [place].   We are unable to tell what your co-pay for medications will be in advance as this is different depending on your insurance coverage. However, we may be able to find a substitute  medication at lower cost or fill out paperwork to get insurance to cover a needed medication.   If a prior authorization is required to get your medication covered by your insurance company, please allow [place] 1-2 business days to complete this process.  Drug prices often vary depending on where the prescription is filled and some pharmacies may offer cheaper prices.  The website www.goodrx.com contains coupons for medications through different pharmacies. The prices here do not account for what the cost may be with help from insurance (it may be cheaper with your insurance), but the website can give you the price if you did not use any insurance.  - You can print the associated coupon and take it with your prescription to the pharmacy.  - You may also stop by our office during regular business hours and pick up a GoodRx coupon card.  - If you need your prescription sent electronically to a different pharmacy, notify our office through [facility] or by phone at [phone] option 4.    Winlevi apply a small amount to chin/jaw 1 to 2 times a day for acne

## 2020-05-10 ENCOUNTER — Encounter: Payer: Self-pay | Admitting: Gastroenterology

## 2020-05-18 NOTE — Discharge Instructions (Signed)

## 2020-05-20 ENCOUNTER — Other Ambulatory Visit: Payer: Self-pay

## 2020-05-20 ENCOUNTER — Ambulatory Visit: Payer: Managed Care, Other (non HMO) | Admitting: Anesthesiology

## 2020-05-20 ENCOUNTER — Encounter: Payer: Self-pay | Admitting: Gastroenterology

## 2020-05-20 ENCOUNTER — Encounter
Admission: RE | Disposition: A | Discharge status: Home / Self Care | Payer: Self-pay | Source: Home / Self Care | Attending: Gastroenterology

## 2020-05-20 ENCOUNTER — Ambulatory Visit
Admission: RE | Admit: 2020-05-20 | Discharge: 2020-05-20 | Disposition: A | Discharge status: Home / Self Care | Payer: Managed Care, Other (non HMO) | Attending: Gastroenterology | Admitting: Gastroenterology

## 2020-05-20 DIAGNOSIS — Z882 Allergy status to sulfonamides status: Secondary | ICD-10-CM | POA: Insufficient documentation

## 2020-05-20 DIAGNOSIS — Z1211 Encounter for screening for malignant neoplasm of colon: Secondary | ICD-10-CM | POA: Diagnosis present

## 2020-05-20 DIAGNOSIS — K635 Polyp of colon: Secondary | ICD-10-CM | POA: Insufficient documentation

## 2020-05-20 DIAGNOSIS — Z79899 Other long term (current) drug therapy: Secondary | ICD-10-CM | POA: Insufficient documentation

## 2020-05-20 DIAGNOSIS — K641 Second degree hemorrhoids: Secondary | ICD-10-CM | POA: Insufficient documentation

## 2020-05-20 HISTORY — PX: POLYPECTOMY: SHX5525

## 2020-05-20 HISTORY — PX: COLONOSCOPY WITH PROPOFOL: SHX5780

## 2020-05-20 SURGERY — COLONOSCOPY WITH PROPOFOL
Anesthesia: Monitor Anesthesia Care

## 2020-05-20 MED ORDER — LIDOCAINE HCL (CARDIAC) PF 100 MG/5ML IV SOSY
PREFILLED_SYRINGE | INTRAVENOUS | Status: DC | PRN
  Administered 2020-05-20: 50 mg via INTRAVENOUS

## 2020-05-20 MED ORDER — STERILE WATER FOR IRRIGATION IR SOLN
Status: DC | PRN
  Administered 2020-05-20: 150 mL

## 2020-05-20 MED ORDER — PROPOFOL 10 MG/ML IV BOLUS
INTRAVENOUS | Status: DC | PRN
  Administered 2020-05-20: 40 mg via INTRAVENOUS
  Administered 2020-05-20: 100 mg via INTRAVENOUS
  Administered 2020-05-20 (×2): 40 mg via INTRAVENOUS

## 2020-05-20 MED ORDER — SODIUM CHLORIDE 0.9 % IV SOLN: INTRAVENOUS | Status: DC

## 2020-05-20 MED ORDER — LACTATED RINGERS IV SOLN: INTRAVENOUS | Status: DC

## 2020-05-20 SURGICAL SUPPLY — 22 items
CLIP HMST 235XBRD CATH ROT (MISCELLANEOUS) IMPLANT
CLIP RESOLUTION 360 11X235 (MISCELLANEOUS)
ELECT REM PT RETURN 9FT ADLT (ELECTROSURGICAL)
ELECTRODE REM PT RTRN 9FT ADLT (ELECTROSURGICAL) IMPLANT
FORCEPS BIOP RAD 4 LRG CAP 4 (CUTTING FORCEPS) IMPLANT
GOWN CVR UNV OPN BCK APRN NK (MISCELLANEOUS) ×4 IMPLANT
GOWN ISOL THUMB LOOP REG UNIV (MISCELLANEOUS) ×6
INJECTOR VARIJECT VIN23 (MISCELLANEOUS) IMPLANT
KIT DEFENDO VALVE AND CONN (KITS) IMPLANT
KIT PRC NS LF DISP ENDO (KITS) ×2 IMPLANT
KIT PROCEDURE OLYMPUS (KITS) ×3
MANIFOLD NEPTUNE II (INSTRUMENTS) ×3 IMPLANT
MARKER SPOT ENDO TATTOO 5ML (MISCELLANEOUS) IMPLANT
PROBE APC STR FIRE (PROBE) IMPLANT
RETRIEVER NET ROTH 2.5X230 LF (MISCELLANEOUS) IMPLANT
SNARE COLD EXACTO (MISCELLANEOUS) ×3 IMPLANT
SNARE SHORT THROW 13M SML OVAL (MISCELLANEOUS) IMPLANT
SNARE SNG USE RND 15MM (INSTRUMENTS) IMPLANT
SPOT EX ENDOSCOPIC TATTOO (MISCELLANEOUS)
TRAP ETRAP POLY (MISCELLANEOUS) ×3 IMPLANT
VARIJECT INJECTOR VIN23 (MISCELLANEOUS)
WATER STERILE IRR 250ML POUR (IV SOLUTION) ×3 IMPLANT

## 2020-05-20 NOTE — Anesthesia Procedure Notes (Signed)
Procedure Name: MAC Date/Time: 05/20/2020 9:23 AM Performed by: Jeannene Patella, CRNA Pre-anesthesia Checklist: Patient identified, Emergency Drugs available, Suction available, Patient being monitored and Timeout performed Patient Re-evaluated:Patient Re-evaluated prior to induction Oxygen Delivery Method: Nasal cannula Placement Confirmation: positive ETCO2 and breath sounds checked- equal and bilateral

## 2020-05-20 NOTE — Op Note (Signed)
[facility] Gastroenterology Patient Name: Judy Sims Procedure Date: 05/20/2020 9:12 AM MRN: [id number] Account #: [id number] Date of Birth: 1975/05/30 Admit Type: Outpatient Age: 45 Room: [facility] OR ROOM 01 Gender: Female Note Status: Finalized Procedure:             Colonoscopy Indications:           Screening for colorectal malignant neoplasm Providers:             [name] MD, MD Medicines:             Propofol per Anesthesia Complications:         No immediate complications. Procedure:             Pre-Anesthesia Assessment:                        - Prior to the procedure, a History and Physical was                         performed, and patient medications and allergies were                         reviewed. The patient's tolerance of previous                         anesthesia was also reviewed. The risks and benefits                         of the procedure and the sedation options and risks                         were discussed with the patient. All questions were                         answered, and informed consent was obtained. Prior                         Anticoagulants: The patient has taken no previous                         anticoagulant or antiplatelet agents. ASA Grade                         Assessment: II - A patient with mild systemic disease.                         After reviewing the risks and benefits, the patient                         was deemed in satisfactory condition to undergo the                         procedure.                        After obtaining informed consent, the colonoscope was                         passed under direct vision. Throughout the procedure,  the patient's blood pressure, pulse, and oxygen                         saturations were monitored continuously. The was                         introduced through the anus and advanced to the the                         cecum, identified  by appendiceal orifice and ileocecal                         valve. The colonoscopy was performed without                         difficulty. The patient tolerated the procedure well.                         The quality of the bowel preparation was excellent. Findings:      The perianal and digital rectal examinations were normal.      A 4 mm polyp was found in the sigmoid colon. The polyp was sessile. The       polyp was removed with a cold snare. Resection and retrieval were       complete.      Non-bleeding external internal hemorrhoids were found during       retroflexion. The hemorrhoids were Grade II (internal hemorrhoids that       prolapse but reduce spontaneously). Impression:            - One 4 mm polyp in the sigmoid colon, removed with a                         cold snare. Resected and retrieved.                        - Non-bleeding external internal hemorrhoids. Recommendation:        - Discharge patient to home.                        - Resume previous diet.                        - Continue present medications.                        - Await pathology results.                        - Repeat colonoscopy in 7 years for surveillance if                         adenomatous and 10 years if hyperplastic. Procedure Code(s):     --- Professional ---                        [phone], Colonoscopy, flexible; with removal of                         tumor(s), polyp(s), or other lesion(s) by snare  technique Diagnosis Code(s):     --- Professional ---                        Z12.11, Encounter for screening for malignant neoplasm                         of colon                        K63.5, Polyp of colon CPT copyright 2019 American Medical Association. All rights reserved. The codes documented in this report are preliminary and upon coder review may  be revised to meet current compliance requirements. [name] MD, MD 05/20/2020 9:43:08 AM This report has been  signed electronically. Number of Addenda: 0 Note Initiated On: 05/20/2020 9:12 AM Scope Withdrawal Time: 0 hours 9 minutes 14 seconds  Total Procedure Duration: 0 hours 14 minutes 39 seconds  Estimated Blood Loss:  Estimated blood loss: none.      [facility]

## 2020-05-20 NOTE — Anesthesia Postprocedure Evaluation (Signed)
Anesthesia Post Note  Patient: Judy Sims  Procedure(s) Performed: COLONOSCOPY WITH PROPOFOL (N/A ) POLYPECTOMY     Patient location during evaluation: PACU Anesthesia Type: MAC Level of consciousness: awake and alert Pain management: pain level controlled Vital Signs Assessment: post-procedure vital signs reviewed and stable Respiratory status: spontaneous breathing, nonlabored ventilation, respiratory function stable and patient connected to nasal cannula oxygen Cardiovascular status: blood pressure returned to baseline and stable Postop Assessment: no apparent nausea or vomiting Anesthetic complications: no   No complications documented.  Trecia Rogers

## 2020-05-20 NOTE — Transfer of Care (Signed)
Immediate Anesthesia Transfer of Care Note  Patient: Judy Sims  Procedure(s) Performed: COLONOSCOPY WITH PROPOFOL (N/A ) POLYPECTOMY  Patient Location: PACU  Anesthesia Type: MAC  Level of Consciousness: awake, alert  and patient cooperative  Airway and Oxygen Therapy: Patient Spontanous Breathing and Patient connected to supplemental oxygen  Post-op Assessment: Post-op Vital signs reviewed, Patient's Cardiovascular Status Stable, Respiratory Function Stable, Patent Airway and No signs of Nausea or vomiting  Post-op Vital Signs: Reviewed and stable  Complications: No complications documented.

## 2020-05-20 NOTE — Anesthesia Preprocedure Evaluation (Signed)
Anesthesia Evaluation  Patient identified by MRN, date of birth, ID band Patient awake    Reviewed: Allergy & Precautions, H&P , NPO status , Patient's Chart, lab work & pertinent test results, reviewed documented beta blocker date and time   History of Anesthesia Complications (+) PONV and history of anesthetic complications  Airway Mallampati: II  TM Distance: >3 FB Neck ROM: full    Dental no notable dental hx.    Pulmonary neg pulmonary ROS,    Pulmonary exam normal breath sounds clear to auscultation       Cardiovascular Exercise Tolerance: Good negative cardio ROS Normal cardiovascular exam Rhythm:regular Rate:Normal     Neuro/Psych  Headaches, negative psych ROS   GI/Hepatic Neg liver ROS, GERD  ,  Endo/Other  negative endocrine ROS  Renal/GU negative Renal ROS  negative genitourinary   Musculoskeletal   Abdominal   Peds  Hematology negative hematology ROS (+)   Anesthesia Other Findings   Reproductive/Obstetrics negative OB ROS                             Anesthesia Physical Anesthesia Plan  ASA: II  Anesthesia Plan: MAC   Post-op Pain Management:    Induction:   PONV Risk Score and Plan:   Airway Management Planned:   Additional Equipment:   Intra-op Plan:   Post-operative Plan:   Informed Consent: I have reviewed the patients History and Physical, chart, labs and discussed the procedure including the risks, benefits and alternatives for the proposed anesthesia with the patient or authorized representative who has indicated his/her understanding and acceptance.     Dental Advisory Given  Plan Discussed with: CRNA and Anesthesiologist  Anesthesia Plan Comments:         Anesthesia Quick Evaluation

## 2020-05-20 NOTE — H&P (Signed)
Judy Lame, MD [facility] [address], [place], [place] [address] Phone: [phone] Fax : [phone]  Primary Care Physician:  Mar Daring, PA-C Primary Gastroenterologist:  Dr. Allen Norris  Pre-Procedure History & Physical: HPI:  Judy Sims is a 45 y.o. female is here for a screening colonoscopy.   Past Medical History:  Diagnosis Date  . Anemia    H/O YEARS AGO WITH HEAVY PERIODS-NO PROBLEM SINCE   . Anxiety   . Complication of anesthesia    HARD TO WAKE UP AFTER BREAST SURGERY  . Dysplastic nevus 12/2019   left posterior shoulder, mild  . GERD (gastroesophageal reflux disease)   . Heart murmur    AS A CHILD  . Migraine    MIGRAINES associated with menstrual cycles  . PONV (postoperative nausea and vomiting)     Past Surgical History:  Procedure Laterality Date  . ABDOMINAL HYSTERECTOMY    . AUGMENTATION MAMMAPLASTY Bilateral 2008   left reduction then implant  . BREAST ENHANCEMENT SURGERY Left   . BREAST REDUCTION SURGERY  2008  . CYSTOSCOPY N/A 11/22/2015   Procedure: CYSTOSCOPY;  Surgeon: Malachy Mood, MD;  Location: [facility];  Service: Gynecology;  Laterality: N/A;  . HYSTEROSCOPY WITH D & C N/A 10/20/2015   Procedure: DILATATION AND CURETTAGE /HYSTEROSCOPY;  Surgeon: Malachy Mood, MD;  Location: [facility];  Service: Gynecology;  Laterality: N/A;  . LAPAROSCOPIC BILATERAL SALPINGECTOMY Bilateral 11/22/2015   Procedure: LAPAROSCOPIC BILATERAL SALPINGECTOMY;  Surgeon: Malachy Mood, MD;  Location: [facility];  Service: Gynecology;  Laterality: Bilateral;  . LAPAROSCOPIC HYSTERECTOMY N/A 11/22/2015   Procedure: HYSTERECTOMY TOTAL LAPAROSCOPIC;  Surgeon: Malachy Mood, MD;  Location: [facility];  Service: Gynecology;  Laterality: N/A;  . LASIK Bilateral 2006  . WISDOM TOOTH EXTRACTION      Prior to Admission medications   Medication Sig Start Date End Date Taking? Authorizing Provider  Ascorbic Acid (VITAMIN C PO) Take by mouth.   Yes  [provider]  cetirizine (ZYRTEC) 10 MG tablet Take 10 mg by mouth as needed for allergies.   Yes [provider]  Clascoterone (WINLEVI) 1 % CREA Apply 1 application topically as directed. Qd to bid aa face for acne 04/04/20  Yes Brendolyn Patty, MD  COLLAGEN PO Take by mouth.   Yes [provider]  Ivermectin (SOOLANTRA) 1 % CREA APPLY A THIN COAT TO THE ENTIRE FACE EVERY NIGHT AT BEDTIME 01/05/20  Yes Brendolyn Patty, MD  Multiple Vitamin (MULTI VITAMIN DAILY) TABS Take 1 tablet by mouth daily.   Yes [provider]  Multiple Vitamins-Minerals (ZINC PO) Take by mouth.   Yes [provider]  spironolactone (ALDACTONE) 100 MG tablet Take 1 tablet (100 mg total) by mouth daily. 01/05/20  Yes Brendolyn Patty, MD    Allergies as of 03/10/2020 - Review Complete 03/10/2020  Allergen Reaction Noted  . Other Rash 10/18/2015  . Sulfa antibiotics Rash 10/18/2015    Family History  Problem Relation Age of Onset  . Colon cancer Maternal Grandfather   . Diabetes Brother   . Diabetes Maternal Uncle   . Breast cancer Neg Hx     Social History   Socioeconomic History  . Marital status: Married    Spouse name: Not on file  . Number of children: Not on file  . Years of education: Not on file  . Highest education level: Not on file  Occupational History  . Not on file  Tobacco Use  . Smoking status: Never  Smoker  . Smokeless tobacco: Never Used  Vaping Use  . Vaping Use: Never used  Substance and Sexual Activity  . Alcohol use: Yes    Comment: socially  . Drug use: No  . Sexual activity: Yes    Birth control/protection: Surgical  Other Topics Concern  . Not on file  Social History Narrative  . Not on file   Social Determinants of Health   Financial Resource Strain: Not on file  Food Insecurity: Not on file  Transportation Needs: Not on file  Physical Activity: Not on file  Stress: Not on file  Social Connections: Not on file  Intimate  Partner Violence: Not on file    Review of Systems: See HPI, otherwise negative ROS  Physical Exam: BP 116/67   Pulse 99   Temp 98.7 F (37.1 C)   Resp 20   Wt 59.5 kg   LMP 11/17/2015 (Exact Date)   SpO2 95%   BMI 21.83 kg/m  General:   Alert,  pleasant and cooperative in NAD Head:  Normocephalic and atraumatic. Neck:  Supple; no masses or thyromegaly. Lungs:  Clear throughout to auscultation.    Heart:  Regular rate and rhythm. Abdomen:  Soft, nontender and nondistended. Normal bowel sounds, without guarding, and without rebound.   Neurologic:  Alert and  oriented x4;  grossly normal neurologically.  Impression/Plan: STAPHANY DITTON is now here to undergo a screening colonoscopy.  Risks, benefits, and alternatives regarding colonoscopy have been reviewed with the patient.  Questions have been answered.  All parties agreeable.

## 2020-05-23 ENCOUNTER — Encounter: Payer: Self-pay | Admitting: Gastroenterology

## 2020-05-23 LAB — SURGICAL PATHOLOGY

## 2020-05-25 ENCOUNTER — Encounter: Payer: Self-pay | Admitting: Gastroenterology

## 2020-07-05 ENCOUNTER — Ambulatory Visit: Payer: Managed Care, Other (non HMO) | Admitting: Dermatology

## 2020-08-16 ENCOUNTER — Other Ambulatory Visit: Payer: Self-pay

## 2020-08-16 DIAGNOSIS — L700 Acne vulgaris: Secondary | ICD-10-CM

## 2020-08-16 MED ORDER — SPIRONOLACTONE 50 MG PO TABS: 50.0000 mg | ORAL_TABLET | Freq: Every day | ORAL | 1 refills | Status: DC

## 2020-08-16 NOTE — Progress Notes (Signed)
Pt called and requested refill for Spironolactone. She requested 90 day supply as it is cheaper with her insurance.

## 2021-02-07 ENCOUNTER — Ambulatory Visit: Payer: Managed Care, Other (non HMO) | Admitting: Dermatology

## 2021-02-07 ENCOUNTER — Other Ambulatory Visit: Payer: Self-pay

## 2021-02-07 DIAGNOSIS — Z1283 Encounter for screening for malignant neoplasm of skin: Secondary | ICD-10-CM | POA: Diagnosis not present

## 2021-02-07 DIAGNOSIS — L814 Other melanin hyperpigmentation: Secondary | ICD-10-CM

## 2021-02-07 DIAGNOSIS — L821 Other seborrheic keratosis: Secondary | ICD-10-CM

## 2021-02-07 DIAGNOSIS — D2261 Melanocytic nevi of right upper limb, including shoulder: Secondary | ICD-10-CM | POA: Diagnosis not present

## 2021-02-07 DIAGNOSIS — L700 Acne vulgaris: Secondary | ICD-10-CM

## 2021-02-07 DIAGNOSIS — L719 Rosacea, unspecified: Secondary | ICD-10-CM

## 2021-02-07 DIAGNOSIS — D1800 Hemangioma unspecified site: Secondary | ICD-10-CM

## 2021-02-07 DIAGNOSIS — B078 Other viral warts: Secondary | ICD-10-CM | POA: Diagnosis not present

## 2021-02-07 DIAGNOSIS — D225 Melanocytic nevi of trunk: Secondary | ICD-10-CM

## 2021-02-07 DIAGNOSIS — D229 Melanocytic nevi, unspecified: Secondary | ICD-10-CM

## 2021-02-07 DIAGNOSIS — L578 Other skin changes due to chronic exposure to nonionizing radiation: Secondary | ICD-10-CM

## 2021-02-07 MED ORDER — SPIRONOLACTONE 50 MG PO TABS: 50.0000 mg | ORAL_TABLET | Freq: Every day | ORAL | 1 refills | Status: DC

## 2021-02-07 MED ORDER — IVERMECTIN 1 % EX CREA: TOPICAL_CREAM | CUTANEOUS | 6 refills | Status: DC

## 2021-02-07 MED ORDER — WINLEVI 1 % EX CREA: 1.0000 "application " | TOPICAL_CREAM | CUTANEOUS | 6 refills | Status: AC

## 2021-02-07 NOTE — Patient Instructions (Addendum)
Spironolactone can cause increased urination and cause blood pressure to decrease. Please watch for signs of lightheadedness and be cautious when changing position. It can sometimes cause breast tenderness or an irregular period in premenopausal women. It can also increase potassium. The increase in potassium usually is not a concern unless you are taking other medicines that also increase potassium, so please be sure your doctor knows all of the other medications you are taking. This medication should not be taken by pregnant women.  This medicine should also not be taken together with sulfa drugs like Bactrim (trimethoprim/sulfamethexazole).     Melanoma ABCDEs  Melanoma is the most dangerous type of skin cancer, and is the leading cause of death from skin disease.  You are more likely to develop melanoma if you: Have light-colored skin, light-colored eyes, or red or blond hair Spend a lot of time in the sun Tan regularly, either outdoors or in a tanning bed Have had blistering sunburns, especially during childhood Have a close family member who has had a melanoma Have atypical moles or large birthmarks  Early detection of melanoma is key since treatment is typically straightforward and cure rates are extremely high if we catch it early.   The first sign of melanoma is often a change in a mole or a new dark spot.  The ABCDE system is a way of remembering the signs of melanoma.  A for asymmetry:  The two halves do not match. B for border:  The edges of the growth are irregular. C for color:  A mixture of colors are present instead of an even brown color. D for diameter:  Melanomas are usually (but not always) greater than 106mm - the size of a pencil eraser. E for evolution:  The spot keeps changing in size, shape, and color.  Please check your skin once per month between visits. You can use a small mirror in front and a large mirror behind you to keep an eye on the back side or your body.    If you see any new or changing lesions before your next follow-up, please call to schedule a visit.  Please continue daily skin protection including broad spectrum sunscreen SPF 30+ to sun-exposed areas, reapplying every 2 hours as needed when you're outdoors.   Staying in the shade or wearing long sleeves, sun glasses (UVA+UVB protection) and wide brim hats (4-inch brim around the entire circumference of the hat) are also recommended for sun protection.     If You Need Anything After Your Visit  If you have any questions or concerns for your doctor, please call our main line at [phone] and press option 4 to reach your doctor's medical assistant. If no one answers, please leave a voicemail as directed and we will return your call as soon as possible. Messages left after 4 pm will be answered the following business day.   You may also send [place] a message via [place]. We typically respond to MyChart messages within 1-2 business days.  For prescription refills, please ask your pharmacy to contact our office. Our fax number is [phone].  If you have an urgent issue when the clinic is closed that cannot wait until the next business day, you can page your doctor at the number below.    Please note that while we do our best to be available for urgent issues outside of office hours, we are not available 24/7.   If you have an urgent issue and are unable to  reach [place], you may choose to seek medical care at your doctor's office, retail clinic, urgent care center, or emergency room.  If you have a medical emergency, please immediately call 911 or go to the emergency department.  Pager Numbers  - Dr. Nehemiah Massed: [phone]  - Dr. Laurence Ferrari: [phone]  - Dr. Nicole Kindred: [phone]  In the event of inclement weather, please call our main line at [phone] for an update on the status of any delays or closures.  Dermatology Medication Tips: Please keep the boxes that topical medications  come in in order to help keep track of the instructions about where and how to use these. Pharmacies typically print the medication instructions only on the boxes and not directly on the medication tubes.   If your medication is too expensive, please contact our office at [phone] option 4 or send [place] a message through [place].   We are unable to tell what your co-pay for medications will be in advance as this is different depending on your insurance coverage. However, we may be able to find a substitute medication at lower cost or fill out paperwork to get insurance to cover a needed medication.   If a prior authorization is required to get your medication covered by your insurance company, please allow [place] 1-2 business days to complete this process.  Drug prices often vary depending on where the prescription is filled and some pharmacies may offer cheaper prices.  The website www.goodrx.com contains coupons for medications through different pharmacies. The prices here do not account for what the cost may be with help from insurance (it may be cheaper with your insurance), but the website can give you the price if you did not use any insurance.  - You can print the associated coupon and take it with your prescription to the pharmacy.  - You may also stop by our office during regular business hours and pick up a GoodRx coupon card.  - If you need your prescription sent electronically to a different pharmacy, notify our office through [facility] or by phone at [phone] option 4.     Si Usted Necesita Algo Despus de Su Visita  Tambin puede enviarnos un mensaje a travs de Pharmacist, community. Por lo general respondemos a los mensajes de MyChart en el transcurso de 1 a 2 das hbiles.  Para renovar recetas, por favor pida a su farmacia que se ponga en contacto con nuestra oficina. Harland Dingwall de fax es [place] [phone].  Si tiene un asunto urgente cuando la clnica est cerrada y que no  puede esperar hasta el siguiente da hbil, puede llamar/localizar a su doctor(a) al nmero que aparece a continuacin.   Por favor, tenga en cuenta que aunque hacemos todo lo posible para estar disponibles para asuntos urgentes fuera del horario de [place], no estamos disponibles las 24 horas del da, los 7 das de la [place].   Si tiene un problema urgente y no puede comunicarse con nosotros, puede optar por buscar atencin mdica  en el consultorio de su doctor(a), en una clnica privada, en un centro de atencin urgente o en una sala de emergencias.  Si tiene Engineering geologist, por favor llame inmediatamente al 911 o vaya a la sala de emergencias.  Nmeros de bper  - Dr. Nehemiah Massed: [phone]  - Dra. Moye: [phone]  - Dra. Nicole Kindred: [phone]  En caso de inclemencias del [place], por favor llame a Johnsie Kindred principal al [phone] para una actualizacin sobre el [place] de cualquier  retraso o cierre.  Consejos para la medicacin en dermatologa: Por favor, guarde las cajas en las que vienen los medicamentos de uso tpico para ayudarle a seguir las instrucciones sobre dnde y cmo usarlos. Las farmacias generalmente imprimen las instrucciones del medicamento slo en las cajas y no directamente en los tubos del [place].   Si su medicamento es muy caro, por favor, pngase en contacto con Zigmund Daniel llamando al [phone] y presione la opcin 4 o envenos un mensaje a travs de Pharmacist, community.   No podemos decirle cul ser su copago por los medicamentos por adelantado ya que esto es diferente dependiendo de la cobertura de su seguro. Sin embargo, es posible que podamos encontrar un medicamento sustituto a Electrical engineer un formulario para que el seguro cubra el medicamento que se considera necesario.   Si se requiere una autorizacin previa para que su compaa de seguros [place] su medicamento, por favor permtanos de 1 a 2 das hbiles para completar este  proceso.  Los precios de los medicamentos varan con frecuencia dependiendo del Environmental consultant de dnde se surte la receta y alguna farmacias pueden ofrecer precios ms baratos.  El sitio web www.goodrx.com tiene cupones para medicamentos de Airline pilot. Los precios aqu no tienen en cuenta lo que podra costar con la ayuda del seguro (puede ser ms barato con su seguro), pero el sitio web puede darle el precio si no utiliz Research scientist (physical sciences).  - Puede imprimir el cupn correspondiente y llevarlo con su receta a la farmacia.  - Tambin puede pasar por nuestra oficina durante el horario de atencin regular y Charity fundraiser una tarjeta de cupones de GoodRx.  - Si necesita que su receta se enve electrnicamente a una farmacia diferente, informe a nuestra oficina a travs de MyChart de [place] o por telfono llamando al [phone] y presione la opcin 4.

## 2021-02-07 NOTE — Progress Notes (Signed)
Follow-Up Visit   Subjective  Judy Sims is a 46 y.o. female who presents for the following: Follow-up (Patient here today for 1 year tbse. Patient reports a small spot at left leg that is itchy and has been there for months. ).  The patient presents for Total-Body Skin Exam (TBSE) for skin cancer screening and mole check.  The patient has spots, moles and lesions to be evaluated, some may be new or changing and the patient has concerns that these could be cancer.  The following portions of the chart were reviewed this encounter and updated as appropriate:      Review of Systems: No other skin or systemic complaints except as noted in HPI or Assessment and Plan.   Objective  Well appearing patient in no apparent distress; mood and affect are within normal limits.  A full examination was performed including scalp, head, eyes, ears, nose, lips, neck, chest, axillae, abdomen, back, buttocks, bilateral upper extremities, bilateral lower extremities, hands, feet, fingers, toes, fingernails, and toenails. All findings within normal limits unless otherwise noted below.  Head - Anterior (Face) Mild erythema on malar cheeks and chin  face Cystic papule on right chin  right upper arm 2.5 mm medium dark brown macule   Left mid upper back 3 x 2 mm medium dark brown macule   left medial thigh x 1 2 mm flat flesh papule    Assessment & Plan  Rosacea Head - Anterior (Face)  Chronic condition with duration or expected duration over one year. Currently well-controlled.  Rosacea is a chronic progressive skin condition usually affecting the face of adults, causing redness and/or acne bumps. It is treatable but not curable. It sometimes affects the eyes (ocular rosacea) as well. It may respond to topical and/or systemic medication and can flare with stress, sun exposure, alcohol, exercise and some foods.  Daily application of broad spectrum spf 30+ sunscreen to face is recommended to  reduce flares.  Continue Soolantra Cream apply to face qhs. Rfs [facility] to [facility].       Ivermectin (SOOLANTRA) 1 % CREA - Head - Anterior (Face) APPLY A THIN COAT TO THE ENTIRE FACE EVERY NIGHT AT BEDTIME  Acne vulgaris face  Chronic Cystic Acne (pt has had a hysterectomy but has ovaries), improved on treatment   Cont Spironolactone 50 mg by mouth daily Cont Winlevi cr qd/bid, Rfs sent to [facility] 122/75   Spironolactone can cause increased urination and cause blood pressure to decrease. Please watch for signs of lightheadedness and be cautious when changing position. It can sometimes cause breast tenderness or an irregular period in premenopausal women. It can also increase potassium. The increase in potassium usually is not a concern unless you are taking other medicines that also increase potassium, so please be sure your doctor knows all of the other medications you are taking. This medication should not be taken by pregnant women.  This medicine should also not be taken together with sulfa drugs like Bactrim (trimethoprim/sulfamethexazole).    Clascoterone (WINLEVI) 1 % CREA - face Apply 1 application topically as directed. Qd to bid aa face for acne  spironolactone (ALDACTONE) 50 MG tablet - face Take 1 tablet (50 mg total) by mouth daily.  Nevus (2) right upper arm; Left mid upper back   Benign-appearing. Stable.  Observation.  Call clinic for new or changing lesions.  Recommend daily use of broad spectrum spf 30+ sunscreen to sun-exposed areas.  Flat wart left medial thigh x 1  Discussed viral etiology and risk of spread.  Discussed multiple treatments may be required to clear warts.  Discussed possible post-treatment dyspigmentation and risk of recurrence.      Destruction of lesion - left medial thigh x 1  Destruction method: cryotherapy   Informed consent: discussed and consent obtained   Lesion destroyed using liquid  nitrogen: Yes   Region frozen until ice ball extended beyond lesion: Yes   Outcome: patient tolerated procedure well with no complications   Post-procedure details: wound care instructions given   Additional details:  Prior to procedure, discussed risks of blister formation, small wound, skin dyspigmentation, or rare scar following cryotherapy. Recommend Vaseline ointment to treated areas while healing.   Lentigines - Scattered tan macules - Due to sun exposure - Benign-appearing, observe - Recommend daily broad spectrum sunscreen SPF 30+ to sun-exposed areas, reapply every 2 hours as needed. - Call for any changes  Seborrheic Keratoses - Stuck-on, waxy, tan-brown papules and/or plaques  - Benign-appearing - Discussed benign etiology and prognosis. - Observe - Call for any changes  Melanocytic Nevi - Tan-brown and/or pink-flesh-colored symmetric macules and papules - Benign appearing on exam today - Observation - Call clinic for new or changing moles - Recommend daily use of broad spectrum spf 30+ sunscreen to sun-exposed areas.   Hemangiomas - Red papules - Discussed benign nature - Observe - Call for any changes  Actinic Damage - Chronic condition, secondary to cumulative UV/sun exposure - diffuse scaly erythematous macules with underlying dyspigmentation - Recommend daily broad spectrum sunscreen SPF 30+ to sun-exposed areas, reapply every 2 hours as needed.  - Staying in the shade or wearing long sleeves, sun glasses (UVA+UVB protection) and wide brim hats (4-inch brim around the entire circumference of the hat) are also recommended for sun protection.  - Call for new or changing lesions.  Skin cancer screening performed today.  I, Ruthell Rummage, CMA, am acting as scribe for Brendolyn Patty, MD.  Return for 1 year tbse, acne, rosacea .  Documentation: I have reviewed the above documentation for accuracy and completeness, and I agree with the above.  Brendolyn Patty MD

## 2021-03-06 ENCOUNTER — Encounter: Payer: Self-pay | Admitting: Advanced Practice Midwife

## 2021-03-06 ENCOUNTER — Other Ambulatory Visit: Payer: Self-pay

## 2021-03-06 ENCOUNTER — Ambulatory Visit (INDEPENDENT_AMBULATORY_CARE_PROVIDER_SITE_OTHER): Payer: Managed Care, Other (non HMO) | Admitting: Advanced Practice Midwife

## 2021-03-06 VITALS — BP 122/70 | Ht 65.0 in | Wt 140.0 lb

## 2021-03-06 DIAGNOSIS — Z1239 Encounter for other screening for malignant neoplasm of breast: Secondary | ICD-10-CM

## 2021-03-06 DIAGNOSIS — Z0000 Encounter for general adult medical examination without abnormal findings: Secondary | ICD-10-CM

## 2021-03-06 NOTE — Progress Notes (Signed)
Gynecology Annual Exam  PCP: Mar Daring, PA-C  Chief Complaint:  Chief Complaint  Patient presents with   Annual Exam    History of Present Illness: Patient is a 46 y.o. G0P0000 presents for annual exam. The patient has no complaints today.   LMP: Patient's last menstrual period was 11/17/2015 (exact date).   The patient is sexually active. She currently uses status post hysterectomy for contraception. She denies dyspareunia.  The patient does perform self breast exams.  There is no notable family history of breast or ovarian cancer in her family.  The patient wears seatbelts: yes.   The patient has regular exercise: her usual exercise is walking/stretching/strength, however, currently due to circumstances amount is limited, she admits healthy diet, hydration, sleep and adequate calcium/vitamin D.    The patient denies current symptoms of depression.    Review of Systems: Review of Systems  Constitutional:  Negative for chills and fever.  HENT:  Negative for congestion, ear discharge, ear pain, hearing loss, sinus pain and sore throat.   Eyes:  Negative for blurred vision and double vision.  Respiratory:  Negative for cough, shortness of breath and wheezing.   Cardiovascular:  Negative for chest pain, palpitations and leg swelling.  Gastrointestinal:  Negative for abdominal pain, blood in stool, constipation, diarrhea, heartburn, melena, nausea and vomiting.  Genitourinary:  Negative for dysuria, flank pain, frequency, hematuria and urgency.  Musculoskeletal:  Negative for back pain, joint pain and myalgias.  Skin:  Negative for itching and rash.  Neurological:  Negative for dizziness, tingling, tremors, sensory change, speech change, focal weakness, seizures, loss of consciousness, weakness and headaches.  Endo/Heme/Allergies:  Negative for environmental allergies. Does not bruise/bleed easily.  Psychiatric/Behavioral:  Negative for depression, hallucinations, memory  loss, substance abuse and suicidal ideas. The patient is not nervous/anxious and does not have insomnia.    Past Medical History:  Patient Active Problem List   Diagnosis Date Noted   Encounter for screening colonoscopy    Polyp of sigmoid colon    Chest pain at rest 10/03/2017   Fatigue 10/03/2017   GERD (gastroesophageal reflux disease) 10/03/2017   Intermittent palpitations 10/03/2017   S/P laparoscopic hysterectomy 11/22/2015   Migraines 12/06/2014    Past Surgical History:  Past Surgical History:  Procedure Laterality Date   ABDOMINAL HYSTERECTOMY     AUGMENTATION MAMMAPLASTY Bilateral 2008   left reduction then implant   BREAST ENHANCEMENT SURGERY Left    BREAST REDUCTION SURGERY  2008   COLONOSCOPY WITH PROPOFOL N/A 05/20/2020   Procedure: COLONOSCOPY WITH PROPOFOL;  Surgeon: [name], MD;  Location: [place];  Service: Endoscopy;  Laterality: N/A;  priority 4   CYSTOSCOPY N/A 11/22/2015   Procedure: CYSTOSCOPY;  Surgeon: Malachy Mood, MD;  Location: [facility];  Service: Gynecology;  Laterality: N/A;   HYSTEROSCOPY WITH D & C N/A 10/20/2015   Procedure: DILATATION AND CURETTAGE /HYSTEROSCOPY;  Surgeon: Malachy Mood, MD;  Location: [facility];  Service: Gynecology;  Laterality: N/A;   LAPAROSCOPIC BILATERAL SALPINGECTOMY Bilateral 11/22/2015   Procedure: LAPAROSCOPIC BILATERAL SALPINGECTOMY;  Surgeon: Malachy Mood, MD;  Location: [facility];  Service: Gynecology;  Laterality: Bilateral;   LAPAROSCOPIC HYSTERECTOMY N/A 11/22/2015   Procedure: HYSTERECTOMY TOTAL LAPAROSCOPIC;  Surgeon: Malachy Mood, MD;  Location: [facility];  Service: Gynecology;  Laterality: N/A;   LASIK Bilateral 2006   POLYPECTOMY  05/20/2020   Procedure: POLYPECTOMY;  Surgeon: [name], MD;  Location: [place];  Service: Endoscopy;;   WISDOM  TOOTH EXTRACTION      Gynecologic History:  Patient's last menstrual period was 11/17/2015 (exact date). Contraception:  status post hysterectomy Last Pap: prior to hysterectomy Results were:  no abnormalities  Last mammogram: 1 year ago Results were: BI-RAD I  Obstetric History: G0P0000  Family History:  Family History  Problem Relation Age of Onset   Colon cancer Maternal Grandfather    Diabetes Brother    Diabetes Maternal Uncle    Breast cancer Neg Hx     Social History:  Social History   Socioeconomic History   Marital status: Married    Spouse name: Not on file   Number of children: Not on file   Years of education: Not on file   Highest education level: Not on file  Occupational History   Not on file  Tobacco Use   Smoking status: Never   Smokeless tobacco: Never  Vaping Use   Vaping Use: Never used  Substance and Sexual Activity   Alcohol use: Yes    Comment: socially   Drug use: No   Sexual activity: Yes    Birth control/protection: Surgical  Other Topics Concern   Not on file  Social History Narrative   Not on file   Social Determinants of Health   Financial Resource Strain: Not on file  Food Insecurity: Not on file  Transportation Needs: Not on file  Physical Activity: Not on file  Stress: Not on file  Social Connections: Not on file  Intimate Partner Violence: Not on file    Allergies:  Allergies  Allergen Reactions   Other Rash    CATS, DOGS AND HORSES causes difficulty breathing if around her face   Sulfa Antibiotics Rash    Medications: Prior to Admission medications   Medication Sig Start Date End Date Taking? Authorizing Provider  Ascorbic Acid (VITAMIN C PO) Take by mouth.   Yes [provider]  cetirizine (ZYRTEC) 10 MG tablet Take 10 mg by mouth as needed for allergies.   Yes [provider]  Clascoterone (WINLEVI) 1 % CREA Apply 1 application topically as directed. Qd to bid aa face for acne 02/07/21  Yes Brendolyn Patty, MD  COLLAGEN PO Take by mouth.   Yes [provider]  Ivermectin (SOOLANTRA) 1 % CREA APPLY A THIN COAT  TO THE ENTIRE FACE EVERY NIGHT AT BEDTIME 02/07/21  Yes Brendolyn Patty, MD  Multiple Vitamin (MULTI VITAMIN DAILY) TABS Take 1 tablet by mouth daily.   Yes [provider]  Multiple Vitamins-Minerals (ZINC PO) Take by mouth.   Yes [provider]  spironolactone (ALDACTONE) 50 MG tablet Take 1 tablet (50 mg total) by mouth daily. 02/07/21  Yes Brendolyn Patty, MD    Physical Exam Vitals: Blood pressure 122/70, height '5\' 5"'$  (1.651 m), weight 140 lb (63.5 kg), last menstrual period 11/17/2015.  General: NAD HEENT: normocephalic, anicteric Thyroid: no enlargement, no palpable nodules Pulmonary: No increased work of breathing, CTAB Cardiovascular: RRR, distal pulses 2+ Breast: Breast symmetrical, no tenderness, no palpable nodules or masses, no skin or nipple retraction present, no nipple discharge.  No axillary or supraclavicular lymphadenopathy. Abdomen: NABS, soft, non-tender, non-distended.  Umbilicus without lesions.  No hepatomegaly, splenomegaly or masses palpable. No evidence of hernia  Genitourinary: deferred for no concerns/PAP discontinued Extremities: no edema, erythema, or tenderness Neurologic: Grossly intact Psychiatric: mood appropriate, affect full   Assessment: 46 y.o. G0P0000 routine annual exam  Plan: Problem List Items Addressed This Visit   None Visit Diagnoses  Well woman exam without gynecological exam    -  Primary   Breast screening       Relevant Orders   MM 3D SCREEN BREAST BILATERAL       1) Mammogram - recommend yearly screening mammogram.  Mammogram Was ordered today  2) STI screening  was offered and declined  3) ASCCP guidelines and rationale discussed.  Status: discontinue secondary to prior hysterectomy   4) Contraception - the patient is currently using  status post hysterectomy.    5) Colonoscopy: screening done in 2022 -- Screening recommended starting at age 80 for average risk individuals, age 20 for individuals deemed at  increased risk (including African Americans) and recommended to continue until age 52.  For patient age 39-85 individualized approach is recommended.  Gold standard screening is via colonoscopy, Cologuard screening is an acceptable alternative for patient unwilling or unable to undergo colonoscopy.  "Colorectal cancer screening for average?risk adults: 2018 guideline update from the [place]: A Cancer Journal for Clinicians: May 30, 2016   6) Routine healthcare maintenance including cholesterol, diabetes screening discussed  managed by employer- [employer]  7) Return in about 1 year (around 03/07/2022) for annual established gyn.   Rod Can, [facility] 03/06/2021, 2:17 PM

## 2021-04-12 ENCOUNTER — Ambulatory Visit
Admission: RE | Admit: 2021-04-12 | Discharge: 2021-04-12 | Disposition: A | Discharge status: Home / Self Care | Payer: Managed Care, Other (non HMO) | Source: Ambulatory Visit | Attending: Advanced Practice Midwife | Admitting: Advanced Practice Midwife

## 2021-04-12 DIAGNOSIS — Z1231 Encounter for screening mammogram for malignant neoplasm of breast: Secondary | ICD-10-CM | POA: Diagnosis not present

## 2021-04-12 DIAGNOSIS — Z1239 Encounter for other screening for malignant neoplasm of breast: Secondary | ICD-10-CM

## 2021-08-08 ENCOUNTER — Other Ambulatory Visit: Payer: Self-pay | Admitting: Dermatology

## 2021-08-08 DIAGNOSIS — L700 Acne vulgaris: Secondary | ICD-10-CM

## 2022-02-12 ENCOUNTER — Encounter: Payer: Self-pay | Admitting: Dermatology

## 2022-02-12 ENCOUNTER — Ambulatory Visit: Payer: Managed Care, Other (non HMO) | Admitting: Dermatology

## 2022-02-12 VITALS — BP 113/60 | HR 79

## 2022-02-12 DIAGNOSIS — L821 Other seborrheic keratosis: Secondary | ICD-10-CM

## 2022-02-12 DIAGNOSIS — Z86018 Personal history of other benign neoplasm: Secondary | ICD-10-CM

## 2022-02-12 DIAGNOSIS — L578 Other skin changes due to chronic exposure to nonionizing radiation: Secondary | ICD-10-CM

## 2022-02-12 DIAGNOSIS — L249 Irritant contact dermatitis, unspecified cause: Secondary | ICD-10-CM

## 2022-02-12 DIAGNOSIS — D225 Melanocytic nevi of trunk: Secondary | ICD-10-CM

## 2022-02-12 DIAGNOSIS — L700 Acne vulgaris: Secondary | ICD-10-CM

## 2022-02-12 DIAGNOSIS — L719 Rosacea, unspecified: Secondary | ICD-10-CM

## 2022-02-12 DIAGNOSIS — D229 Melanocytic nevi, unspecified: Secondary | ICD-10-CM

## 2022-02-12 DIAGNOSIS — L814 Other melanin hyperpigmentation: Secondary | ICD-10-CM

## 2022-02-12 DIAGNOSIS — D2261 Melanocytic nevi of right upper limb, including shoulder: Secondary | ICD-10-CM

## 2022-02-12 DIAGNOSIS — L709 Acne, unspecified: Secondary | ICD-10-CM

## 2022-02-12 DIAGNOSIS — Z1283 Encounter for screening for malignant neoplasm of skin: Secondary | ICD-10-CM

## 2022-02-12 MED ORDER — SPIRONOLACTONE 50 MG PO TABS: ORAL_TABLET | ORAL | 1 refills | Status: DC

## 2022-02-12 MED ORDER — IVERMECTIN 1 % EX CREA: TOPICAL_CREAM | CUTANEOUS | 6 refills | Status: DC

## 2022-02-12 NOTE — Progress Notes (Signed)
Follow-Up Visit   Subjective  Judy Sims is a 47 y.o. female who presents for the following: Annual Exam (1 year tbse, hx of dysplastic nevi, hx of rosacea, using soolantra cream. Hx of acne using spironolactone 50 mg tab qd and winlevi prn. ).  The patient presents for Total-Body Skin Exam (TBSE) for skin cancer screening and mole check.  The patient has spots, moles and lesions to be evaluated, some may be new or changing and the patient has concerns that these could be cancer.  The following portions of the chart were reviewed this encounter and updated as appropriate:      Review of Systems: No other skin or systemic complaints except as noted in HPI or Assessment and Plan.   Objective  Well appearing patient in no apparent distress; mood and affect are within normal limits.  A full examination was performed including scalp, head, eyes, ears, nose, lips, neck, chest, axillae, abdomen, back, buttocks, bilateral upper extremities, bilateral lower extremities, hands, feet, fingers, toes, fingernails, and toenails. All findings within normal limits unless otherwise noted below.  Head - Anterior (Face) Erythema on medial cheeks with few small pink papules   face Clear at exam   right upper arm 2.5 mm medium dark brown macule   left mid upper back 3 x 2 mm medium dark brown macule   left medial breast 1.5 mm medial dark brown macule   left wrist Erythematous patch    Assessment & Plan  Rosacea Head - Anterior (Face)  Chronic condition with duration or expected duration over one year. Currently well-controlled.   Rosacea is a chronic progressive skin condition usually affecting the face of adults, causing redness and/or acne bumps. It is treatable but not curable. It sometimes affects the eyes (ocular rosacea) as well. It may respond to topical and/or systemic medication and can flare with stress, sun exposure, alcohol, exercise and some foods.  Daily application of  broad spectrum spf 30+ sunscreen to face is recommended to reduce flares.   Continue Soolantra Cream apply to face qhs. [facility]fs [facility].  Related Medications Ivermectin (SOOLANTRA) 1 % CREA APPLY A THIN COAT TO THE ENTIRE FACE EVERY NIGHT AT BEDTIME  Acne, unspecified acne type face  Chronic Cystic Acne (pt has had a hysterectomy but has ovaries), Chronic condition with duration or expected duration over one year. Currently well-controlled.   Cont Spironolactone 50 mg by mouth daily Cont Winlevi cr qd/bid prn, pt has Today's BP 113/60   Spironolactone can cause increased urination and cause blood pressure to decrease. Please watch for signs of lightheadedness and be cautious when changing position. It can sometimes cause breast tenderness or an irregular period in premenopausal women. It can also increase potassium. The increase in potassium usually is not a concern unless you are taking other medicines that also increase potassium, so please be sure your doctor knows all of the other medications you are taking. This medication should not be taken by pregnant women.  This medicine should also not be taken together with sulfa drugs like Bactrim (trimethoprim/sulfamethexazole).      Acne vulgaris  Related Medications Clascoterone (WINLEVI) 1 % CREA Apply 1 application topically as directed. Qd to bid aa face for acne  spironolactone (ALDACTONE) 50 MG tablet TAKE 1 TABLET(50 MG) BY MOUTH DAILY  Nevus (3) right upper arm; left medial breast; left mid upper back  Benign-appearing. Stable compared to previous visit. Observation.  Call clinic for new or changing  moles.  Recommend daily use of broad spectrum spf 30+ sunscreen to sun-exposed areas.    Irritant contact dermatitis, unspecified trigger left wrist  Due to Apple watch, probable irritant from moisture and rubbing  Start sample of Zoryve cream - apply topically qd until clear   Lentigines - Scattered tan  macules - Due to sun exposure - Benign-appearing, observe - Recommend daily broad spectrum sunscreen SPF 30+ to sun-exposed areas, reapply every 2 hours as needed. - Call for any changes  Seborrheic Keratoses - Stuck-on, waxy, tan-brown papules and/or plaques  - Benign-appearing - Discussed benign etiology and prognosis. - Observe - Call for any changes  Melanocytic Nevi - Tan-brown and/or pink-flesh-colored symmetric macules and papules - Benign appearing on exam today - Observation - Call clinic for new or changing moles - Recommend daily use of broad spectrum spf 30+ sunscreen to sun-exposed areas.   Hemangiomas - Red papules - Discussed benign nature - Observe - Call for any changes  Actinic Damage - Chronic condition, secondary to cumulative UV/sun exposure - diffuse scaly erythematous macules with underlying dyspigmentation - Recommend daily broad spectrum sunscreen SPF 30+ to sun-exposed areas, reapply every 2 hours as needed.  - Staying in the shade or wearing long sleeves, sun glasses (UVA+UVB protection) and wide brim hats (4-inch brim around the entire circumference of the hat) are also recommended for sun protection.  - Call for new or changing lesions.  History of Dysplastic Nevi Left posterior shoulder (mild) 2021 - No evidence of recurrence today - Recommend regular full body skin exams - Recommend daily broad spectrum sunscreen SPF 30+ to sun-exposed areas, reapply every 2 hours as needed.  - Call if any new or changing lesions are noted between office visits  Skin cancer screening performed today. Return in about 1 year (around 02/13/2023) for TBSE.  I, Ruthell Rummage, CMA, am acting as scribe for Brendolyn Patty, MD.  Documentation: I have reviewed the above documentation for accuracy and completeness, and I agree with the above.  Brendolyn Patty MD

## 2022-02-12 NOTE — Patient Instructions (Addendum)
For acne  Continue spironolactone 50 mg tab by mouth daily.  Spironolactone can cause increased urination and cause blood pressure to decrease. Please watch for signs of lightheadedness and be cautious when changing position. It can sometimes cause breast tenderness or an irregular period in premenopausal women. It can also increase potassium. The increase in potassium usually is not a concern unless you are taking other medicines that also increase potassium, so please be sure your doctor knows all of the other medications you are taking. This medication should not be taken by pregnant women.  This medicine should also not be taken together with sulfa drugs like Bactrim (trimethoprim/sulfamethexazole).   Continue winlevi cream - apply to face as needed for acne.      Continue soolantra cream as prescribed.   Rosacea  What is rosacea? Rosacea (say: ro-zay-sha) is a common skin disease that usually begins as a trend of flushing or blushing easily.  As rosacea progresses, a persistent redness in the center of the face will develop and may gradually spread beyond the nose and cheeks to the forehead and chin.  In some cases, the ears, chest, and back could be affected.  Rosacea may appear as tiny blood vessels or small red bumps that occur in crops.  Frequently they can contain pus, and are called "pustules".  If the bumps do not contain pus, they are referred to as "papules".  Rarely, in prolonged, untreated cases of rosacea, the oil glands of the nose and cheeks may become permanently enlarged.  This is called rhinophyma, and is seen more frequently in men.  Signs and Risks In its beginning stages, rosacea tends to come and go, which makes it difficult to recognize.  It can start as intermittent flushing of the face.  Eventually, blood vessels may become permanently visible.  Pustules and papules can appear, but can be mistaken for adult acne.  People of all races, ages, genders and ethnic groups  are at risk of developing rosacea.  However, it is more common in women (especially around menopause) and adults with fair skin between the ages of 69 and 38.  Treatment Dermatologists typically recommend a combination of treatments to effectively manage rosacea.  Treatment can improve symptoms and may stop the progression of the rosacea.  Treatment may involve both topical and oral medications.  The tetracycline antibiotics are often used for their anti-inflammatory effect; however, because of the possibility of developing antibiotic resistance, they should not be used long term at full dose.  For dilated blood vessels the options include electrodessication (uses electric current through a small needle), laser treatment, and cosmetics to hide the redness.   With all forms of treatment, improvement is a slow process, and patients may not see any results for the first 3-4 weeks.  It is very important to avoid the sun and other triggers.  Patients must wear sunscreen daily.  Skin Care Instructions: Cleanse the skin with a mild soap such as CeraVe cleanser, Cetaphil cleanser, or Dove soap once or twice daily as needed. Moisturize with Eucerin Redness Relief Daily Perfecting Lotion (has a subtle green tint), CeraVe Moisturizing Cream, or Oil of Olay Daily Moisturizer with sunscreen every morning and/or night as recommended. Makeup should be "non-comedogenic" (won't clog pores) and be labeled "for sensitive skin". Good choices for cosmetics are: Neutrogena, Almay, and Physician's Formula.  Any product with a green tint tends to offset a red complexion. If your eyes are dry and irritated, use artificial tears 2-3 times per  day and cleanse the eyelids daily with baby shampoo.  Have your eyes examined at least every 2 years.  Be sure to tell your eye doctor that you have rosacea. Alcoholic beverages tend to cause flushing of the skin, and may make rosacea worse. Always wear sunscreen, protect your skin from  extreme hot and cold temperatures, and avoid spicy foods, hot drinks, and mechanical irritation such as rubbing, scrubbing, or massaging the face.  Avoid harsh skin cleansers, cleansing masks, astringents, and exfoliation. If a particular product burns or makes your face feel tight, then it is likely to flare your rosacea. If you are having difficulty finding a sunscreen that you can tolerate, you may try switching to a chemical-free sunscreen.  These are ones whose active ingredient is zinc oxide or titanium dioxide only.  They should also be fragrance free, non-comedogenic, and labeled for sensitive skin. Rosacea triggers may vary from person to person.  There are a variety of foods that have been reported to trigger rosacea.  Some patients find that keeping a diary of what they were doing when they flared helps them avoid triggers.   Melanoma ABCDEs  Melanoma is the most dangerous type of skin cancer, and is the leading cause of death from skin disease.  You are more likely to develop melanoma if you: Have light-colored skin, light-colored eyes, or red or blond hair Spend a lot of time in the sun Tan regularly, either outdoors or in a tanning bed Have had blistering sunburns, especially during childhood Have a close family member who has had a melanoma Have atypical moles or large birthmarks  Early detection of melanoma is key since treatment is typically straightforward and cure rates are extremely high if we catch it early.   The first sign of melanoma is often a change in a mole or a new dark spot.  The ABCDE system is a way of remembering the signs of melanoma.  A for asymmetry:  The two halves do not match. B for border:  The edges of the growth are irregular. C for color:  A mixture of colors are present instead of an even brown color. D for diameter:  Melanomas are usually (but not always) greater than 20m - the size of a pencil eraser. E for evolution:  The spot keeps changing in  size, shape, and color.  Please check your skin once per month between visits. You can use a small mirror in front and a large mirror behind you to keep an eye on the back side or your body.   If you see any new or changing lesions before your next follow-up, please call to schedule a visit.  Please continue daily skin protection including broad spectrum sunscreen SPF 30+ to sun-exposed areas, reapplying every 2 hours as needed when you're outdoors.   Staying in the shade or wearing long sleeves, sun glasses (UVA+UVB protection) and wide brim hats (4-inch brim around the entire circumference of the hat) are also recommended for sun protection.    Due to recent changes in healthcare laws, you may see results of your pathology and/or laboratory studies on MyChart before the doctors have had a chance to review them. We understand that in some cases there may be results that are confusing or concerning to you. Please understand that not all results are received at the same time and often the doctors may need to interpret multiple results in order to provide you with the best plan of care or course  of treatment. Therefore, we ask that you please give [place] 2 business days to thoroughly review all your results before contacting the office for clarification. Should we see a critical lab result, you will be contacted sooner.   If You Need Anything After Your Visit  If you have any questions or concerns for your doctor, please call our main line at [phone] and press option 4 to reach your doctor's medical assistant. If no one answers, please leave a voicemail as directed and we will return your call as soon as possible. Messages left after 4 pm will be answered the following business day.   You may also send [place] a message via [place]. We typically respond to MyChart messages within 1-2 business days.  For prescription refills, please ask your pharmacy to contact our office. Our fax number is  [phone].  If you have an urgent issue when the clinic is closed that cannot wait until the next business day, you can page your doctor at the number below.    Please note that while we do our best to be available for urgent issues outside of office hours, we are not available 24/7.   If you have an urgent issue and are unable to reach [place], you may choose to seek medical care at your doctor's office, retail clinic, urgent care center, or emergency room.  If you have a medical emergency, please immediately call 911 or go to the emergency department.  Pager Numbers  - Dr. Nehemiah Massed: [phone]  - Dr. Laurence Ferrari: [phone]  - Dr. Nicole Kindred: [phone]  In the event of inclement weather, please call our main line at [phone] for an update on the status of any delays or closures.  Dermatology Medication Tips: Please keep the boxes that topical medications come in in order to help keep track of the instructions about where and how to use these. Pharmacies typically print the medication instructions only on the boxes and not directly on the medication tubes.   If your medication is too expensive, please contact our office at [phone] option 4 or send [place] a message through [place].   We are unable to tell what your co-pay for medications will be in advance as this is different depending on your insurance coverage. However, we may be able to find a substitute medication at lower cost or fill out paperwork to get insurance to cover a needed medication.   If a prior authorization is required to get your medication covered by your insurance company, please allow [place] 1-2 business days to complete this process.  Drug prices often vary depending on where the prescription is filled and some pharmacies may offer cheaper prices.  The website www.goodrx.com contains coupons for medications through different pharmacies. The prices here do not account for what the cost may be with help from  insurance (it may be cheaper with your insurance), but the website can give you the price if you did not use any insurance.  - You can print the associated coupon and take it with your prescription to the pharmacy.  - You may also stop by our office during regular business hours and pick up a GoodRx coupon card.  - If you need your prescription sent electronically to a different pharmacy, notify our office through [facility] or by phone at [phone] option 4.     Si Usted Necesita Algo Despus de Su Visita  Tambin puede enviarnos un mensaje a travs de Pharmacist, community. Por lo general respondemos a los  mensajes de MyChart en el transcurso de 1 a 2 das hbiles.  Para renovar recetas, por favor pida a su farmacia que se ponga en contacto con nuestra oficina. Harland Dingwall de fax es [place] [phone].  Si tiene un asunto urgente cuando la clnica est cerrada y que no puede esperar hasta el siguiente da hbil, puede llamar/localizar a su doctor(a) al nmero que aparece a continuacin.   Por favor, tenga en cuenta que aunque hacemos todo lo posible para estar disponibles para asuntos urgentes fuera del horario de [place], no estamos disponibles las 24 horas del da, los 7 das de la [place].   Si tiene un problema urgente y no puede comunicarse con nosotros, puede optar por buscar atencin mdica  en el consultorio de su doctor(a), en una clnica privada, en un centro de atencin urgente o en una sala de emergencias.  Si tiene Engineering geologist, por favor llame inmediatamente al 911 o vaya a la sala de emergencias.  Nmeros de bper  - Dr. Nehemiah Massed: [phone]  - Dra. Moye: [phone]  - Dra. Nicole Kindred: [phone]  En caso de inclemencias del [place], por favor llame a Johnsie Kindred principal al [phone] para una actualizacin sobre el [place] de cualquier retraso o cierre.  Consejos para la medicacin en dermatologa: Por favor, guarde las cajas en las que vienen los  medicamentos de uso tpico para ayudarle a seguir las instrucciones sobre dnde y cmo usarlos. Las farmacias generalmente imprimen las instrucciones del medicamento slo en las cajas y no directamente en los tubos del [place].   Si su medicamento es muy caro, por favor, pngase en contacto con Zigmund Daniel llamando al [phone] y presione la opcin 4 o envenos un mensaje a travs de Pharmacist, community.   No podemos decirle cul ser su copago por los medicamentos por adelantado ya que esto es diferente dependiendo de la cobertura de su seguro. Sin embargo, es posible que podamos encontrar un medicamento sustituto a Electrical engineer un formulario para que el seguro cubra el medicamento que se considera necesario.   Si se requiere una autorizacin previa para que su compaa de seguros [place] su medicamento, por favor permtanos de 1 a 2 das hbiles para completar este proceso.  Los precios de los medicamentos varan con frecuencia dependiendo del Environmental consultant de dnde se surte la receta y alguna farmacias pueden ofrecer precios ms baratos.  El sitio web www.goodrx.com tiene cupones para medicamentos de Airline pilot. Los precios aqu no tienen en cuenta lo que podra costar con la ayuda del seguro (puede ser ms barato con su seguro), pero el sitio web puede darle el precio si no utiliz Research scientist (physical sciences).  - Puede imprimir el cupn correspondiente y llevarlo con su receta a la farmacia.  - Tambin puede pasar por nuestra oficina durante el horario de atencin regular y Charity fundraiser una tarjeta de cupones de GoodRx.  - Si necesita que su receta se enve electrnicamente a una farmacia diferente, informe a nuestra oficina a travs de MyChart de [place] o por telfono llamando al [phone] y presione la opcin 4.

## 2022-05-01 ENCOUNTER — Encounter: Payer: Self-pay | Admitting: Advanced Practice Midwife

## 2022-05-01 ENCOUNTER — Ambulatory Visit: Payer: Managed Care, Other (non HMO) | Admitting: Advanced Practice Midwife

## 2022-05-01 VITALS — BP 110/78 | HR 74 | Resp 15 | Ht 65.0 in | Wt 149.7 lb

## 2022-05-01 DIAGNOSIS — Z1239 Encounter for other screening for malignant neoplasm of breast: Secondary | ICD-10-CM

## 2022-05-01 DIAGNOSIS — Z0000 Encounter for general adult medical examination without abnormal findings: Secondary | ICD-10-CM

## 2022-05-01 DIAGNOSIS — Z1321 Encounter for screening for nutritional disorder: Secondary | ICD-10-CM

## 2022-05-01 DIAGNOSIS — Z01419 Encounter for gynecological examination (general) (routine) without abnormal findings: Secondary | ICD-10-CM | POA: Diagnosis not present

## 2022-05-01 DIAGNOSIS — R232 Flushing: Secondary | ICD-10-CM

## 2022-05-02 ENCOUNTER — Encounter: Payer: Self-pay | Admitting: Advanced Practice Midwife

## 2022-05-02 LAB — CBC WITH DIFFERENTIAL/PLATELET
Basophils Absolute: 0.1 10*3/uL (ref 0.0–0.2)
Basos: 1 %
EOS (ABSOLUTE): 0 10*3/uL (ref 0.0–0.4)
Eos: 0 %
Hematocrit: 43.4 % (ref 34.0–46.6)
Hemoglobin: 14.4 g/dL (ref 11.1–15.9)
Immature Grans (Abs): 0 10*3/uL (ref 0.0–0.1)
Immature Granulocytes: 0 %
Lymphocytes Absolute: 2.5 10*3/uL (ref 0.7–3.1)
Lymphs: 25 %
MCH: 31.2 pg (ref 26.6–33.0)
MCHC: 33.2 g/dL (ref 31.5–35.7)
MCV: 94 fL (ref 79–97)
Monocytes Absolute: 0.5 10*3/uL (ref 0.1–0.9)
Monocytes: 5 %
Neutrophils Absolute: 6.9 10*3/uL (ref 1.4–7.0)
Neutrophils: 69 %
Platelets: 293 10*3/uL (ref 150–450)
RBC: 4.62 x10E6/uL (ref 3.77–5.28)
RDW: 12.7 % (ref 11.7–15.4)
WBC: 10.1 10*3/uL (ref 3.4–10.8)

## 2022-05-02 LAB — FSH/LH
FSH: 4.5 m[IU]/mL
LH: 7.8 m[IU]/mL

## 2022-05-02 LAB — COMPREHENSIVE METABOLIC PANEL
ALT: 12 IU/L (ref 0–32)
AST: 13 IU/L (ref 0–40)
Albumin/Globulin Ratio: 2.1 (ref 1.2–2.2)
Albumin: 4.6 g/dL (ref 3.9–4.9)
Alkaline Phosphatase: 53 IU/L (ref 44–121)
BUN/Creatinine Ratio: 33 — ABNORMAL HIGH (ref 9–23)
BUN: 28 mg/dL — ABNORMAL HIGH (ref 6–24)
Bilirubin Total: 0.4 mg/dL (ref 0.0–1.2)
CO2: 21 mmol/L (ref 20–29)
Calcium: 9.9 mg/dL (ref 8.7–10.2)
Chloride: 100 mmol/L (ref 96–106)
Creatinine, Ser: 0.85 mg/dL (ref 0.57–1.00)
Globulin, Total: 2.2 g/dL (ref 1.5–4.5)
Glucose: 110 mg/dL — ABNORMAL HIGH (ref 70–99)
Potassium: 4 mmol/L (ref 3.5–5.2)
Sodium: 136 mmol/L (ref 134–144)
Total Protein: 6.8 g/dL (ref 6.0–8.5)
eGFR: 85 mL/min/{1.73_m2} (ref >59)

## 2022-05-02 LAB — VITAMIN D 25 HYDROXY (VIT D DEFICIENCY, FRACTURES): Vit D, 25-Hydroxy: 63.6 ng/mL (ref 30.0–100.0)

## 2022-05-02 LAB — TSH: TSH: 2 u[IU]/mL (ref 0.450–4.500)

## 2022-05-02 NOTE — Progress Notes (Signed)
[facility]  Gynecology Annual Exam  PCP: Margaretann Loveless, PA-C  Chief Complaint:  Chief Complaint  Patient presents with   Annual Exam    History of Present Illness: Patient is a 47 y.o. G0P0000 presents for annual exam. The patient has no complaints today. She mentions some left axillary tenderness that she has noticed in the past few weeks. None today and she has not felt any swollen lymph nodes.  LMP: Patient's last menstrual period was 11/17/2015 (exact date).   The patient is sexually active. She currently uses status post hysterectomy for contraception. She denies dyspareunia.  The patient does perform self breast exams.  There is no notable family history of breast or ovarian cancer in her family.  The patient wears seatbelts: yes.   The patient has regular exercise:  she has regular cardio, strength, stretching exercise. She admits healthy diet, adequate hydration and sleep .  She takes calcium/D3 supplement.  The patient denies current symptoms of depression.    Review of Systems: Review of Systems  Constitutional:  Negative for chills and fever.  HENT:  Negative for congestion, ear discharge, ear pain, hearing loss, sinus pain and sore throat.   Eyes:  Negative for blurred vision and double vision.  Respiratory:  Negative for cough, shortness of breath and wheezing.   Cardiovascular:  Negative for chest pain, palpitations and leg swelling.  Gastrointestinal:  Negative for abdominal pain, blood in stool, constipation, diarrhea, heartburn, melena, nausea and vomiting.  Genitourinary:  Negative for dysuria, flank pain, frequency, hematuria and urgency.  Musculoskeletal:  Negative for back pain, joint pain and myalgias.  Skin:  Negative for itching and rash.  Neurological:  Negative for dizziness, tingling, tremors, sensory change, speech change, focal weakness, seizures, loss of consciousness, weakness and headaches.  Endo/Heme/Allergies:  Negative for environmental  allergies. Does not bruise/bleed easily.  Psychiatric/Behavioral:  Negative for depression, hallucinations, memory loss, substance abuse and suicidal ideas. The patient is not nervous/anxious and does not have insomnia.     Past Medical History:  Patient Active Problem List   Diagnosis Date Noted   Encounter for screening colonoscopy    Polyp of sigmoid colon    Chest pain at rest 10/03/2017   Fatigue 10/03/2017   GERD (gastroesophageal reflux disease) 10/03/2017   Intermittent palpitations 10/03/2017   S/P laparoscopic hysterectomy 11/22/2015   Migraines 12/06/2014    Past Surgical History:  Past Surgical History:  Procedure Laterality Date   ABDOMINAL HYSTERECTOMY     AUGMENTATION MAMMAPLASTY Bilateral 2008   left reduction then implant   BREAST ENHANCEMENT SURGERY Left    BREAST REDUCTION SURGERY  2008   COLONOSCOPY WITH PROPOFOL N/A 05/20/2020   Procedure: COLONOSCOPY WITH PROPOFOL;  Surgeon: Midge Minium, MD;  Location: [facility];  Service: Endoscopy;  Laterality: N/A;  priority 4   CYSTOSCOPY N/A 11/22/2015   Procedure: CYSTOSCOPY;  Surgeon: Vena Austria, MD;  Location: [facility];  Service: Gynecology;  Laterality: N/A;   HYSTEROSCOPY WITH D & C N/A 10/20/2015   Procedure: DILATATION AND CURETTAGE /HYSTEROSCOPY;  Surgeon: Vena Austria, MD;  Location: [facility];  Service: Gynecology;  Laterality: N/A;   LAPAROSCOPIC BILATERAL SALPINGECTOMY Bilateral 11/22/2015   Procedure: LAPAROSCOPIC BILATERAL SALPINGECTOMY;  Surgeon: Vena Austria, MD;  Location: [facility];  Service: Gynecology;  Laterality: Bilateral;   LAPAROSCOPIC HYSTERECTOMY N/A 11/22/2015   Procedure: HYSTERECTOMY TOTAL LAPAROSCOPIC;  Surgeon: Vena Austria, MD;  Location: [facility];  Service: Gynecology;  Laterality: N/A;   LASIK Bilateral 2006  POLYPECTOMY  05/20/2020   Procedure: POLYPECTOMY;  Surgeon: Midge Minium, MD;  Location: [facility];  Service: Endoscopy;;   WISDOM TOOTH  EXTRACTION      Gynecologic History:  Patient's last menstrual period was 11/17/2015 (exact date). Contraception: status post hysterectomy Last Pap: prior to hysterectomy Results were:  no abnormalities  Last mammogram: 2023 Results were: BI-RAD I  Obstetric History: G0P0000  Family History:  Family History  Problem Relation Age of Onset   Colon cancer Maternal Grandfather    Diabetes Brother    Diabetes Maternal Uncle    Breast cancer Neg Hx     Social History:  Social History   Socioeconomic History   Marital status: Married    Spouse name: Not on file   Number of children: Not on file   Years of education: Not on file   Highest education level: Not on file  Occupational History   Not on file  Tobacco Use   Smoking status: Never   Smokeless tobacco: Never  Vaping Use   Vaping Use: Never used  Substance and Sexual Activity   Alcohol use: Yes    Comment: socially   Drug use: No   Sexual activity: Yes    Birth control/protection: Surgical  Other Topics Concern   Not on file  Social History Narrative   Not on file   Social Determinants of Health   Financial Resource Strain: Not on file  Food Insecurity: Not on file  Transportation Needs: Not on file  Physical Activity: Sufficiently Active (02/04/2017)   Exercise Vital Sign    Days of Exercise per Week: 4 days    Minutes of Exercise per Session: 50 min  Stress: No Stress Concern Present (02/04/2017)   [employer] of Occupational Health - Occupational Stress Questionnaire    Feeling of Stress : Not at all  Social Connections: Moderately Integrated (02/04/2017)   Social Connection and Isolation Panel [NHANES]    Frequency of Communication with Friends and Family: More than three times a week    Frequency of Social Gatherings with Friends and Family: Once a week    Attends Religious Services: 1 to 4 times per year    Active Member of [employer] or Organizations: No    Attends Banker Meetings: Never     Marital Status: Married  Catering manager Violence: Not At Risk (02/04/2017)   Humiliation, Afraid, Rape, and Kick questionnaire    Fear of Current or Ex-Partner: No    Emotionally Abused: No    Physically Abused: No    Sexually Abused: No    Allergies:  Allergies  Allergen Reactions   Other Rash    CATS, DOGS AND HORSES causes difficulty breathing if around her face   Sulfa Antibiotics Rash    Medications: Prior to Admission medications   Medication Sig Start Date End Date Taking? Authorizing Provider  Ascorbic Acid (VITAMIN C PO) Take by mouth.   Yes [provider]  cetirizine (ZYRTEC) 10 MG tablet Take 10 mg by mouth as needed for allergies.   Yes [provider]  Clascoterone (WINLEVI) 1 % CREA Apply 1 application topically as directed. Qd to bid aa face for acne 02/07/21  Yes Willeen Niece, MD  COLLAGEN PO Take by mouth.   Yes [provider]  Ivermectin (SOOLANTRA) 1 % CREA APPLY A THIN COAT TO THE ENTIRE FACE EVERY NIGHT AT BEDTIME 02/12/22  Yes Willeen Niece, MD  Multiple Vitamin (MULTI VITAMIN DAILY) TABS Take 1  tablet by mouth daily.   Yes [provider]  Multiple Vitamins-Minerals (ZINC PO) Take by mouth.   Yes [provider]  spironolactone (ALDACTONE) 50 MG tablet TAKE 1 TABLET(50 MG) BY MOUTH DAILY 02/12/22  Yes Willeen Niece, MD    Physical Exam Vitals: Blood pressure 110/78, pulse 74, resp. rate 15, height 5\' 5"  (1.651 m), weight 149 lb 11.2 oz (67.9 kg), last menstrual period 11/17/2015.  General: NAD HEENT: normocephalic, anicteric Thyroid: no enlargement, no palpable nodules Pulmonary: No increased work of breathing, CTAB Cardiovascular: RRR, distal pulses 2+ Breast: Breast symmetrical, no tenderness, no palpable nodules or masses, no skin or nipple retraction present, no nipple discharge.  No axillary or supraclavicular lymphadenopathy. Abdomen: NABS, soft, non-tender, non-distended.  Umbilicus without lesions.   No hepatomegaly, splenomegaly or masses palpable. No evidence of hernia  Genitourinary: deferred for no concerns/no PAP Extremities: no edema, erythema, or tenderness Neurologic: Grossly intact Psychiatric: mood appropriate, affect full   Latest Reference Range & Units 05/01/22 14:09  COMPREHENSIVE METABOLIC PANEL  Rpt !  Sodium 134 - 144 mmol/L 136  Potassium 3.5 - 5.2 mmol/L 4.0  Chloride 96 - 106 mmol/L 100  CO2 20 - 29 mmol/L 21  Glucose 70 - 99 mg/dL 161 (H)  BUN 6 - 24 mg/dL 28 (H)  Creatinine 0.96 - 1.00 mg/dL 0.45  Calcium 8.7 - 40.9 mg/dL 9.9  BUN/Creatinine Ratio 9 - 23  33 (H)  eGFR >59 mL/min/1.73 85  Alkaline Phosphatase 44 - 121 IU/L 53  Albumin 3.9 - 4.9 g/dL 4.6  Albumin/Globulin Ratio 1.2 - 2.2  2.1  AST 0 - 40 IU/L 13  ALT 0 - 32 IU/L 12  Total Protein 6.0 - 8.5 g/dL 6.8  Total Bilirubin 0.0 - 1.2 mg/dL 0.4  Vitamin D, 81-XBJYNWG 30.0 - 100.0 ng/mL 63.6  Globulin, Total 1.5 - 4.5 g/dL 2.2  WBC 3.4 - 95.6 O13Y8/MV 10.1  RBC 3.77 - 5.28 x10E6/uL 4.62  Hemoglobin 11.1 - 15.9 g/dL 78.4  HCT 69.6 - 29.5 % 43.4  MCV 79 - 97 fL 94  MCH 26.6 - 33.0 pg 31.2  MCHC 31.5 - 35.7 g/dL 28.4  RDW 13.2 - 44.0 % 12.7  Platelets 150 - 450 x10E3/uL 293  Neutrophils Not Estab. % 69  Immature Granulocytes Not Estab. % 0  NEUT# 1.4 - 7.0 x10E3/uL 6.9  Lymphocyte # 0.7 - 3.1 x10E3/uL 2.5  Monocytes Absolute 0.1 - 0.9 x10E3/uL 0.5  Basophils Absolute 0.0 - 0.2 x10E3/uL 0.1  Immature Grans (Abs) 0.0 - 0.1 x10E3/uL 0.0  Lymphs Not Estab. % 25  Monocytes Not Estab. % 5  Basos Not Estab. % 1  Eos Not Estab. % 0  EOS (ABSOLUTE) 0.0 - 0.4 x10E3/uL 0.0  LH mIU/mL 7.8  FSH mIU/mL 4.5  TSH 0.450 - 4.500 uIU/mL 2.000  !: Data is abnormal (H): Data is abnormally high Rpt: View report in Results Review for more information  Assessment: 47 y.o. G0P0000 routine annual exam  Plan: Problem List Items Addressed This Visit   None Visit Diagnoses     Well woman exam without  gynecological exam    -  Primary   Relevant Orders   VITAMIN D 25 Hydroxy (Vit-D Deficiency, Fractures) (Completed)   TSH (Completed)   CBC with Differential/Platelet (Completed)   Comprehensive metabolic panel (Completed)   FSH/LH (Completed)   MM 3D SCREENING MAMMOGRAM BILATERAL BREAST   Encounter for vitamin deficiency screening       Relevant Orders  VITAMIN D 25 Hydroxy (Vit-D Deficiency, Fractures) (Completed)   Hot flashes       Relevant Orders   FSH/LH (Completed)   Breast screening       Relevant Orders   MM 3D SCREENING MAMMOGRAM BILATERAL BREAST       1) Mammogram - recommend yearly screening mammogram.  Mammogram Was ordered today  2) STI screening  wasoffered and declined  3) ASCCP guidelines and rationale discussed.  Patient opts for discontinue secondary to prior hysterectomy screening interval  4) Contraception - the patient is currently using  status post hysterectomy.    5) Colonoscopy: done in 2022, next due 2032 -- Screening recommended starting at age 54 for average risk individuals, age 40 for individuals deemed at increased risk (including African Americans) and recommended to continue until age 63.  For patient age 4-85 individualized approach is recommended.  Gold standard screening is via colonoscopy, Cologuard screening is an acceptable alternative for patient unwilling or unable to undergo colonoscopy.  "Colorectal cancer screening for average?risk adults: 2018 guideline update from the American Cancer Society"CA: A Cancer Journal for Clinicians: May 30, 2016   6) Routine healthcare maintenance including CBC, CMP, Vitamin D, TSH, FSH/LH screening discussed/Ordered today. Message sent to patient through MyChart regarding essentially normal labs. Cholesterol and diabetes screen collected through employment program with [employer].  7) Return in about 1 year (around 05/01/2023) for annual established gyn.   Tresea Mall, CNM [facility] 05/02/2022, 12:17 PM

## 2022-05-15 ENCOUNTER — Other Ambulatory Visit: Payer: Self-pay | Admitting: Advanced Practice Midwife

## 2022-05-15 DIAGNOSIS — Z1231 Encounter for screening mammogram for malignant neoplasm of breast: Secondary | ICD-10-CM

## 2022-06-01 ENCOUNTER — Ambulatory Visit
Admission: RE | Admit: 2022-06-01 | Discharge: 2022-06-01 | Disposition: A | Discharge status: Home / Self Care | Payer: Managed Care, Other (non HMO) | Source: Ambulatory Visit | Attending: Advanced Practice Midwife | Admitting: Advanced Practice Midwife

## 2022-06-01 DIAGNOSIS — Z1231 Encounter for screening mammogram for malignant neoplasm of breast: Secondary | ICD-10-CM | POA: Diagnosis present

## 2022-08-10 ENCOUNTER — Other Ambulatory Visit: Payer: Self-pay | Admitting: Dermatology

## 2022-08-10 DIAGNOSIS — L700 Acne vulgaris: Secondary | ICD-10-CM

## 2023-02-19 ENCOUNTER — Encounter: Payer: Self-pay | Admitting: Dermatology

## 2023-02-19 ENCOUNTER — Ambulatory Visit: Payer: Managed Care, Other (non HMO) | Admitting: Dermatology

## 2023-02-19 DIAGNOSIS — L719 Rosacea, unspecified: Secondary | ICD-10-CM

## 2023-02-19 DIAGNOSIS — L821 Other seborrheic keratosis: Secondary | ICD-10-CM

## 2023-02-19 DIAGNOSIS — L700 Acne vulgaris: Secondary | ICD-10-CM

## 2023-02-19 DIAGNOSIS — B351 Tinea unguium: Secondary | ICD-10-CM

## 2023-02-19 DIAGNOSIS — Z7189 Other specified counseling: Secondary | ICD-10-CM

## 2023-02-19 DIAGNOSIS — D225 Melanocytic nevi of trunk: Secondary | ICD-10-CM

## 2023-02-19 DIAGNOSIS — Z1283 Encounter for screening for malignant neoplasm of skin: Secondary | ICD-10-CM

## 2023-02-19 DIAGNOSIS — Z86018 Personal history of other benign neoplasm: Secondary | ICD-10-CM

## 2023-02-19 DIAGNOSIS — D229 Melanocytic nevi, unspecified: Secondary | ICD-10-CM

## 2023-02-19 DIAGNOSIS — L814 Other melanin hyperpigmentation: Secondary | ICD-10-CM

## 2023-02-19 DIAGNOSIS — W908XXA Exposure to other nonionizing radiation, initial encounter: Secondary | ICD-10-CM

## 2023-02-19 DIAGNOSIS — D2261 Melanocytic nevi of right upper limb, including shoulder: Secondary | ICD-10-CM

## 2023-02-19 DIAGNOSIS — L578 Other skin changes due to chronic exposure to nonionizing radiation: Secondary | ICD-10-CM

## 2023-02-19 DIAGNOSIS — D1801 Hemangioma of skin and subcutaneous tissue: Secondary | ICD-10-CM

## 2023-02-19 MED ORDER — IVERMECTIN 1 % EX CREA: TOPICAL_CREAM | CUTANEOUS | 6 refills | Status: AC

## 2023-02-19 MED ORDER — TAVABOROLE 5 % EX SOLN: CUTANEOUS | 11 refills | Status: AC

## 2023-02-19 MED ORDER — TERBINAFINE HCL 250 MG PO TABS: 250.0000 mg | ORAL_TABLET | Freq: Every day | ORAL | 2 refills | Status: DC

## 2023-02-19 MED ORDER — SPIRONOLACTONE 50 MG PO TABS: ORAL_TABLET | ORAL | 3 refills | Status: AC

## 2023-02-19 NOTE — Patient Instructions (Addendum)
 Rosacea: Continue Soolantra cream to face every night at bedtime.   Nail fungus: Start Terbinafine 250 mg once daily   Start Kerydin topical to affected toenail every night at bedtime.   Terbinafine Counseling  Terbinafine is an anti-fungal medicine that can be applied to the skin (over the counter) or taken by mouth (prescription) to treat fungal infections. The pill version is often used to treat fungal infections of the nails or scalp. While most people do not have any side effects from taking terbinafine pills, some possible side effects of the medicine can include taste changes, headache, loss of smell, vision changes, nausea, vomiting, or diarrhea.   Rare side effects can include irritation of the liver, allergic reaction, or decrease in blood counts (which may show up as not feeling well or developing an infection). If you are concerned about any of these side effects, please stop the medicine and call your doctor, or in the case of an emergency such as feeling very unwell, seek immediate medical care.      Acne: Continue Spironolactone 50 mg once daily.   Spironolactone can cause increased urination and cause blood pressure to decrease. Please watch for signs of lightheadedness and be cautious when changing position. It can sometimes cause breast tenderness or an irregular period in premenopausal women. It can also increase potassium. The increase in potassium usually is not a concern unless you are taking other medicines that also increase potassium, so please be sure your doctor knows all of the other medications you are taking. This medication should not be taken by pregnant women.  This medicine should also not be taken together with sulfa drugs like Bactrim (trimethoprim/sulfamethexazole).     Recommend daily broad spectrum sunscreen SPF 30+ to sun-exposed areas, reapply every 2 hours as needed. Call for new or changing lesions.  Staying in the shade or wearing long sleeves, sun  glasses (UVA+UVB protection) and wide brim hats (4-inch brim around the entire circumference of the hat) are also recommended for sun protection.     Melanoma ABCDEs  Melanoma is the most dangerous type of skin cancer, and is the leading cause of death from skin disease.  You are more likely to develop melanoma if you: Have light-colored skin, light-colored eyes, or red or blond hair Spend a lot of time in the sun Tan regularly, either outdoors or in a tanning bed Have had blistering sunburns, especially during childhood Have a close family member who has had a melanoma Have atypical moles or large birthmarks  Early detection of melanoma is key since treatment is typically straightforward and cure rates are extremely high if we catch it early.   The first sign of melanoma is often a change in a mole or a new dark spot.  The ABCDE system is a way of remembering the signs of melanoma.  A for asymmetry:  The two halves do not match. B for border:  The edges of the growth are irregular. C for color:  A mixture of colors are present instead of an even brown color. D for diameter:  Melanomas are usually (but not always) greater than 6mm - the size of a pencil eraser. E for evolution:  The spot keeps changing in size, shape, and color.  Please check your skin once per month between visits. You can use a small mirror in front and a large mirror behind you to keep an eye on the back side or your body.   If you see any new  or changing lesions before your next follow-up, please call to schedule a visit.  Please continue daily skin protection including broad spectrum sunscreen SPF 30+ to sun-exposed areas, reapplying every 2 hours as needed when you're outdoors.   Staying in the shade or wearing long sleeves, sun glasses (UVA+UVB protection) and wide brim hats (4-inch brim around the entire circumference of the hat) are also recommended for sun protection.      Due to recent changes in  healthcare laws, you may see results of your pathology and/or laboratory studies on MyChart before the doctors have had a chance to review them. We understand that in some cases there may be results that are confusing or concerning to you. Please understand that not all results are received at the same time and often the doctors may need to interpret multiple results in order to provide you with the best plan of care or course of treatment. Therefore, we ask that you please give [place] 2 business days to thoroughly review all your results before contacting the office for clarification. Should we see a critical lab result, you will be contacted sooner.   If You Need Anything After Your Visit  If you have any questions or concerns for your doctor, please call our main line at [phone] and press option 4 to reach your doctor's medical assistant. If no one answers, please leave a voicemail as directed and we will return your call as soon as possible. Messages left after 4 pm will be answered the following business day.   You may also send [place] a message via MyChart. We typically respond to MyChart messages within 1-2 business days.  For prescription refills, please ask your pharmacy to contact our office. Our fax number is [phone].  If you have an urgent issue when the clinic is closed that cannot wait until the next business day, you can page your doctor at the number below.    Please note that while we do our best to be available for urgent issues outside of office hours, we are not available 24/7.   If you have an urgent issue and are unable to reach [place], you may choose to seek medical care at your doctor's office, retail clinic, urgent care center, or emergency room.  If you have a medical emergency, please immediately call 911 or go to the emergency department.  Pager Numbers  - Dr. Gwen Pounds: [phone]  - Dr. Roseanne Reno: [phone]  - Dr. Katrinka Blazing: [phone]   In the event of  inclement weather, please call our main line at [phone] for an update on the status of any delays or closures.  Dermatology Medication Tips: Please keep the boxes that topical medications come in in order to help keep track of the instructions about where and how to use these. Pharmacies typically print the medication instructions only on the boxes and not directly on the medication tubes.   If your medication is too expensive, please contact our office at [phone] option 4 or send [place] a message through MyChart.   We are unable to tell what your co-pay for medications will be in advance as this is different depending on your insurance coverage. However, we may be able to find a substitute medication at lower cost or fill out paperwork to get insurance to cover a needed medication.   If a prior authorization is required to get your medication covered by your insurance company, please allow [place] 1-2 business days to complete this process.  Drug prices  often vary depending on where the prescription is filled and some pharmacies may offer cheaper prices.  The website www.goodrx.com contains coupons for medications through different pharmacies. The prices here do not account for what the cost may be with help from insurance (it may be cheaper with your insurance), but the website can give you the price if you did not use any insurance.  - You can print the associated coupon and take it with your prescription to the pharmacy.  - You may also stop by our office during regular business hours and pick up a GoodRx coupon card.  - If you need your prescription sent electronically to a different pharmacy, notify our office through [facility] or by phone at [phone] option 4.     Si Usted Necesita Algo Despus de Su Visita  Tambin puede enviarnos un mensaje a travs de Clinical cytogeneticist. Por lo general respondemos a los mensajes de MyChart en el transcurso de 1 a 2 das hbiles.  Para renovar  recetas, por favor pida a su farmacia que se ponga en contacto con nuestra oficina. Annie Sable de fax es [place] [phone].  Si tiene un asunto urgente cuando la clnica est cerrada y que no puede esperar hasta el siguiente da hbil, puede llamar/localizar a su doctor(a) al nmero que aparece a continuacin.   Por favor, tenga en cuenta que aunque hacemos todo lo posible para estar disponibles para asuntos urgentes fuera del horario de [place], no estamos disponibles las 24 horas del da, los 7 [address]  [address] de la [place].   Si tiene un problema urgente y no puede comunicarse con nosotros, puede optar por buscar atencin mdica  en el consultorio de su doctor(a), en una clnica privada, en un centro de atencin urgente o en una sala de emergencias.  Si tiene Engineer, drilling, por favor llame inmediatamente al 911 o vaya a la sala de emergencias.  Nmeros de bper  - Dr. Gwen Pounds: [phone]  - Dra. Roseanne Reno: [phone]  - Dr. Katrinka Blazing: [phone]   En caso de inclemencias del tiempo, por favor llame a Lacy Duverney principal al [phone] para una actualizacin sobre el [place] de cualquier retraso o cierre.  Consejos para la medicacin en dermatologa: Por favor, guarde las cajas en las que vienen los medicamentos de uso tpico para ayudarle a seguir las instrucciones sobre dnde y cmo usarlos. Las farmacias generalmente imprimen las instrucciones del medicamento slo en las cajas y no directamente en los tubos del [place].   Si su medicamento es muy caro, por favor, pngase en contacto con Rolm Gala llamando al [phone] y presione la opcin 4 o envenos un mensaje a travs de Clinical cytogeneticist.   No podemos decirle cul ser su copago por los medicamentos por adelantado ya que esto es diferente dependiendo de la cobertura de su seguro. Sin embargo, es posible que podamos encontrar un medicamento sustituto a Audiological scientist un formulario para que el seguro cubra el medicamento  que se considera necesario.   Si se requiere una autorizacin previa para que su compaa de seguros [place] su medicamento, por favor permtanos de 1 a 2 das hbiles para completar [address]  Los precios de los medicamentos varan con frecuencia dependiendo del Environmental consultant de dnde se surte la receta y alguna farmacias pueden ofrecer precios ms baratos.  El sitio web www.goodrx.com tiene cupones para medicamentos de Health and safety inspector. Los precios aqu no tienen en cuenta lo que podra costar con la ayuda del seguro (puede ser ms barato  con su seguro), pero el sitio web puede darle el precio si no Visual merchandiser.  - Puede imprimir el cupn correspondiente y llevarlo con su receta a la farmacia.  - Tambin puede pasar por nuestra oficina durante el horario de atencin regular y Education officer, museum una tarjeta de cupones de GoodRx.  - Si necesita que su receta se enve electrnicamente a una farmacia diferente, informe a nuestra oficina a travs de MyChart de [place] o por telfono llamando al [phone] y presione la opcin 4.

## 2023-02-19 NOTE — Progress Notes (Signed)
 Follow-Up Visit   Subjective  Judy Sims is a 48 y.o. female who presents for the following: Skin Cancer Screening and Full Body Skin Exam. Hx of dysplastic nevus.  Thinks has fungus under right great toenail. Dur: few weeks- noticed after taking off nail polish. Does not remember any trauma to nail. Has been using lavender essential.  Also uses topical ivermectin for rosacea and Spironolactone for acne with good control.    The patient presents for Total-Body Skin Exam (TBSE) for skin cancer screening and mole check. The patient has spots, moles and lesions to be evaluated, some may be new or changing and the patient may have concern these could be cancer.    The following portions of the chart were reviewed this encounter and updated as appropriate: medications, allergies, medical history  Review of Systems:  No other skin or systemic complaints except as noted in HPI or Assessment and Plan.  Objective  Well appearing patient in no apparent distress; mood and affect are within normal limits.  A full examination was performed including scalp, head, eyes, ears, nose, lips, neck, chest, axillae, abdomen, back, buttocks, bilateral upper extremities, bilateral lower extremities, hands, feet, fingers, toes, fingernails, and toenails. All findings within normal limits unless otherwise noted below.   Relevant physical exam findings are noted in the Assessment and Plan.      Assessment & Plan   SKIN CANCER SCREENING PERFORMED TODAY.  HISTORY OF DYSPLASTIC NEVUS. Left posterior shoulder, mild atypia. 12/2019. No evidence of recurrence today Recommend regular full body skin exams Recommend daily broad spectrum sunscreen SPF 30+ to sun-exposed areas, reapply every 2 hours as needed.  Call if any new or changing lesions are noted between office visits   ACTINIC DAMAGE - Chronic condition, secondary to cumulative UV/sun exposure - diffuse scaly erythematous macules with underlying  dyspigmentation - Recommend daily broad spectrum sunscreen SPF 30+ to sun-exposed areas, reapply every 2 hours as needed.  - Staying in the shade or wearing long sleeves, sun glasses (UVA+UVB protection) and wide brim hats (4-inch brim around the entire circumference of the hat) are also recommended for sun protection.  - Call for new or changing lesions.  LENTIGINES, SEBORRHEIC KERATOSES, HEMANGIOMAS - Benign normal skin lesions - Benign-appearing - Call for any changes  MELANOCYTIC NEVI - Tan-brown and/or pink-flesh-colored symmetric macules and papules - right upper arm 2.5 mm medium dark brown macule  - left mid upper back 3 x 2 mm medium dark brown macule  - left medial breast 1.5 mm medial dark brown macule  - Benign appearing on exam today. Stable compared to previous visit. - Observation - Call clinic for new or changing moles - Recommend daily use of broad spectrum spf 30+ sunscreen to sun-exposed areas.    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 Photos today  Reviewed labs from 05/01/2022. LFTs WNL.  Chronic and persistent condition with duration or expected duration over one year. Condition is symptomatic/ bothersome to patient. Not currently at goal.  Treatment Plan: Start Terbinafine 250 mg once daily with food  Start Kerydin topical solution to affected toenail every night at bedtime.   Terbinafine Counseling  Terbinafine is an anti-fungal medicine that can be applied to the skin (over the counter) or taken by mouth (prescription) to treat fungal infections. The pill version is often used to treat fungal infections of the nails or scalp. While most people do not have any side effects from taking terbinafine pills, some  possible side effects of the medicine can include taste changes, headache, loss of smell, vision changes, nausea, vomiting, or diarrhea.   Rare side effects can include irritation of the liver, allergic  reaction, or decrease in blood counts (which may show up as not feeling well or developing an infection). If you are concerned about any of these side effects, please stop the medicine and call your doctor, or in the case of an emergency such as feeling very unwell, seek immediate medical care.     Discussed oral treatments and topical treatments. Pt prefers doing oral.  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 Currently well-controlled.   Rosacea is a chronic progressive skin condition usually affecting the face of adults, causing redness and/or acne bumps. It is treatable but not curable. It sometimes affects the eyes (ocular rosacea) as well. It may respond to topical and/or systemic medication and can flare with stress, sun exposure, alcohol, exercise, topical steroids (including hydrocortisone/cortisone 10) and some foods.  Daily application of broad spectrum spf 30+ sunscreen to face is recommended to reduce flares.  Patient denies grittiness of the eyes  Treatment Plan: Continue Soolantra cream to face every night at bedtime.    ACNE VULGARIS Exam: face clear today   BP today: 111/69  Chronic condition with duration or expected duration over one year. Currently well-controlled.  Treatment Plan: Continue Spironolactone 50 mg once daily.  D/C Winlevi cream per pt request  Spironolactone can cause increased urination and cause blood pressure to decrease. Please watch for signs of lightheadedness and be cautious when changing position. It can sometimes cause breast tenderness or an irregular period in premenopausal women. It can also increase potassium. The increase in potassium usually is not a concern unless you are taking other medicines that also increase potassium, so please be sure your doctor knows all of the other medications you are taking. This medication should not be taken by pregnant women.  This  medicine should also not be taken together with sulfa drugs like Bactrim (trimethoprim/sulfamethexazole).    ROSACEA   ACNE VULGARIS   Related Medications Clascoterone (WINLEVI) 1 % CREA Apply 1 application topically as directed. Qd to bid aa face for acne Return in about 1 year (around 02/19/2024) for TBSE, HxDN.  I, Lawson Radar, CMA, am acting as scribe for Willeen Niece, MD.   Documentation: I have reviewed the above documentation for accuracy and completeness, and I agree with the above.  Willeen Niece, MD

## 2023-02-26 ENCOUNTER — Telehealth: Payer: Self-pay

## 2023-02-26 NOTE — Telephone Encounter (Signed)
 TRIAGE VOICEMAIL: Patient requesting referral to [facility]. She's having issues. Nausea/Diarrhea, right side pain under rib cage, radiating pain in her stomach that happened overnight. Her stool is clay colored and she also has indigestion.

## 2023-02-26 NOTE — Telephone Encounter (Signed)
 Spoke with patient. Judy Sims is out of the office. She will be on call Thursday. Inquired if she had reached out to her PCP? Patient advised she does not have PCP currently as everyone she was seeing at [facility] has left. Advised will send to on call provider for review and someone will reach out to her once we receive a reply. Advised can see Urgent Care if needed while awaiting appointment.

## 2023-02-26 NOTE — Telephone Encounter (Signed)
 Patient aware. She states this started over the weekend and got better, but last night started having the pain under her rib cage and stabbing pain in her stomach. She will await Erskine Squibb to review the message. She will go to Urgent Care with any increase in symptoms.

## 2023-02-28 ENCOUNTER — Telehealth: Payer: Self-pay

## 2023-02-28 NOTE — Telephone Encounter (Signed)
 Pt needs Judy Sims to put in a referral for gallbladder pain. She does not have a PCP and has been experiencing pain and nausea. She was aware Judy Sims was on call and wanted me to send her a message. She states she sent Judy Sims a message last Tuesday, Please advise if you can place this referral for her.

## 2023-03-07 ENCOUNTER — Other Ambulatory Visit: Payer: Self-pay | Admitting: Advanced Practice Midwife

## 2023-03-07 DIAGNOSIS — R109 Unspecified abdominal pain: Secondary | ICD-10-CM

## 2023-03-07 NOTE — Progress Notes (Signed)
 Referral to [facility] sent per patient request. Message sent to me when I was out of office and not on call and did not get the information until 03/06/23.

## 2023-05-13 ENCOUNTER — Ambulatory Visit (INDEPENDENT_AMBULATORY_CARE_PROVIDER_SITE_OTHER): Payer: Managed Care, Other (non HMO) | Admitting: Advanced Practice Midwife

## 2023-05-13 ENCOUNTER — Encounter: Payer: Self-pay | Admitting: Advanced Practice Midwife

## 2023-05-13 VITALS — BP 114/79 | HR 75 | Ht 65.0 in | Wt 151.8 lb

## 2023-05-13 DIAGNOSIS — Z01419 Encounter for gynecological examination (general) (routine) without abnormal findings: Secondary | ICD-10-CM | POA: Diagnosis not present

## 2023-05-13 DIAGNOSIS — Z0000 Encounter for general adult medical examination without abnormal findings: Secondary | ICD-10-CM

## 2023-05-13 DIAGNOSIS — Z1239 Encounter for other screening for malignant neoplasm of breast: Secondary | ICD-10-CM

## 2023-05-13 NOTE — Progress Notes (Signed)
 Gynecology Annual Exam  PCP: Angelia Kelp, PA-C  Chief Complaint:  Chief Complaint  Patient presents with   Gynecologic Exam    History of Present Illness: Patient is a 48 y.o. G0P0000 presents for annual exam. The patient has no complaints today. She had an episode of gallbladder pain in March of this year. Normal CT.  LMP: Patient's last menstrual period was 11/17/2015 (exact date).   The patient is sexually active. She currently uses status post hysterectomy for contraception. She denies dyspareunia.  The patient does perform self breast exams.  There is no notable family history of breast or ovarian cancer in her family.  The patient wears seatbelts: yes.   The patient has regular exercise: she does weights and cardio 3 times per week, tries to eat a healthy diet, admits adequate hydration and sleep.    The patient denies current symptoms of depression.    Review of Systems: Review of Systems  Constitutional:  Negative for chills and fever.  HENT:  Negative for congestion, ear discharge, ear pain, hearing loss, sinus pain and sore throat.   Eyes:  Negative for blurred vision and double vision.  Respiratory:  Negative for cough, shortness of breath and wheezing.   Cardiovascular:  Negative for chest pain, palpitations and leg swelling.  Gastrointestinal:  Negative for abdominal pain, blood in stool, constipation, diarrhea, heartburn, melena, nausea and vomiting.  Genitourinary:  Negative for dysuria, flank pain, frequency, hematuria and urgency.  Musculoskeletal:  Negative for back pain, joint pain and myalgias.  Skin:  Negative for itching and rash.  Neurological:  Negative for dizziness, tingling, tremors, sensory change, speech change, focal weakness, seizures, loss of consciousness, weakness and headaches.  Endo/Heme/Allergies:  Negative for environmental allergies. Does not bruise/bleed easily.  Psychiatric/Behavioral:  Negative for depression, hallucinations,  memory loss, substance abuse and suicidal ideas. The patient is not nervous/anxious and does not have insomnia.     Past Medical History:  Patient Active Problem List   Diagnosis Date Noted Date Diagnosed   Polyp of sigmoid colon     GERD (gastroesophageal reflux disease) 10/03/2017    S/P laparoscopic hysterectomy 11/22/2015     Past Surgical History:  Past Surgical History:  Procedure Laterality Date   ABDOMINAL HYSTERECTOMY     AUGMENTATION MAMMAPLASTY Bilateral 2008   left reduction then implant   BREAST ENHANCEMENT SURGERY Left    BREAST REDUCTION SURGERY  2008   COLONOSCOPY WITH PROPOFOL  N/A 05/20/2020   Procedure: COLONOSCOPY WITH PROPOFOL ;  Surgeon: Marnee Sink, MD;  Location: [facility];  Service: Endoscopy;  Laterality: N/A;  priority 4   CYSTOSCOPY N/A 11/22/2015   Procedure: CYSTOSCOPY;  Surgeon: Darl Edu, MD;  Location: [facility];  Service: Gynecology;  Laterality: N/A;   HYSTEROSCOPY WITH D & C N/A 10/20/2015   Procedure: DILATATION AND CURETTAGE /HYSTEROSCOPY;  Surgeon: Darl Edu, MD;  Location: [facility];  Service: Gynecology;  Laterality: N/A;   LAPAROSCOPIC BILATERAL SALPINGECTOMY Bilateral 11/22/2015   Procedure: LAPAROSCOPIC BILATERAL SALPINGECTOMY;  Surgeon: Darl Edu, MD;  Location: [facility] OR[facility];  Service: Gynecology;  Laterality: Bilateral;   LAPAROSCOPIC HYSTERECTOMY N/A 11/22/2015   Procedure: HYSTERECTOMY TOTAL LAPAROSCOPIC;  Surgeon: Darl Edu, MD;  Location: [facility];  Service: Gynecology;  Laterality: N/A;   LASIK Bilateral 2006   POLYPECTOMY  05/20/2020   Procedure: POLYPECTOMY;  Surgeon: Marnee Sink, MD;  Location: [facility];  Service: Endoscopy;;   WISDOM TOOTH EXTRACTION      Gynecologic History:  Patient's last  menstrual period was 11/17/2015 (exact date).  Last Pap: 2017 Results were:  no abnormalities  Last mammogram: 2024 Results were: BI-RAD I  Obstetric History: G0P0000  Family History:   Family History  Problem Relation Age of Onset   Diabetes Brother    Prostate cancer Brother 18 - 25   Diabetes Maternal Uncle    Colon cancer Maternal Grandfather    Breast cancer Neg Hx     Social History:  Social History   Socioeconomic History   Marital status: Married    Spouse name: Not on file   Number of children: Not on file   Years of education: Not on file   Highest education level: Not on file  Occupational History   Not on file  Tobacco Use   Smoking status: Never   Smokeless tobacco: Never  Vaping Use   Vaping status: Never Used  Substance and Sexual Activity   Alcohol use: Yes    Comment: socially   Drug use: No   Sexual activity: Yes    Birth control/protection: Surgical  Other Topics Concern   Not on file  Social History Narrative   Not on file   Social Drivers of Health   Financial Resource Strain: Low Risk  (03/12/2023)   Received from [facility]   Overall Financial Resource Strain (CARDIA)    Difficulty of Paying Living Expenses: Not hard at all  Food Insecurity: No Food Insecurity (03/12/2023)   Received from [facility]   Hunger Vital Sign    Worried About Running Out of Food in the Last Year: Never true    Ran Out of Food in the Last Year: Never true  Transportation Needs: No Transportation Needs (03/12/2023)   Received from [facility] - Transportation    In the past 12 months, has lack of transportation kept you from medical appointments or from getting medications?: No    Lack of Transportation (Non-Medical): No  Physical Activity: Sufficiently Active (02/04/2017)   Exercise Vital Sign    Days of Exercise per Week: 4 days    Minutes of Exercise per Session: 50 min  Stress: No Stress Concern Present (02/04/2017)   [employer] of Occupational Health - Occupational Stress Questionnaire    Feeling of Stress : Not at all  Social Connections: Moderately Integrated (02/04/2017)    Social Connection and Isolation Panel [NHANES]    Frequency of Communication with Friends and Family: More than three times a week    Frequency of Social Gatherings with Friends and Family: Once a week    Attends Religious Services: 1 to 4 times per year    Active Member of [employer] or Organizations: No    Attends Banker Meetings: Never    Marital Status: Married  Catering manager Violence: Not At Risk (02/04/2017)   Humiliation, Afraid, Rape, and Kick questionnaire    Fear of Current or Ex-Partner: No    Emotionally Abused: No    Physically Abused: No    Sexually Abused: No    Allergies:  Allergies  Allergen Reactions   Other Rash    CATS, DOGS AND HORSES causes difficulty breathing if around her face   Sulfa Antibiotics Rash    Medications: Prior to Admission medications   Medication Sig Start Date End Date Taking? Authorizing Provider  Ascorbic Acid (VITAMIN C PO) Take by mouth.   Yes [provider]  cetirizine (ZYRTEC) 10 MG tablet Take 10 mg  by mouth as needed for allergies.   Yes [provider]  Clascoterone  (WINLEVI ) 1 % CREA Apply 1 application topically as directed. Qd to bid aa face for acne 02/07/21  Yes Artemio Larry, MD  COLLAGEN PO Take by mouth.   Yes [provider]  Ivermectin  (SOOLANTRA ) 1 % CREA APPLY A THIN COAT TO THE ENTIRE FACE EVERY NIGHT AT BEDTIME 02/19/23  Yes Artemio Larry, MD  Multiple Vitamin (MULTI VITAMIN DAILY) TABS Take 1 tablet by mouth daily.   Yes [provider]  Multiple Vitamins-Minerals (ZINC PO) Take by mouth.   Yes [provider]  spironolactone  (ALDACTONE ) 50 MG tablet TAKE 1 TABLET(50 MG) BY MOUTH DAILY 02/19/23  Yes Artemio Larry, MD  Tavaborole  5 % SOLN Apply to affected toenails at bedtime 02/19/23  Yes Artemio Larry, MD    Physical Exam Vitals: Blood pressure 114/79, pulse 75, height 5\' 5"  (1.651 m), weight 151 lb 12.8 oz (68.9 kg), last menstrual period  11/17/2015.  General: NAD HEENT: normocephalic, anicteric Thyroid: no enlargement, no palpable nodules Pulmonary: No increased work of breathing, CTAB Cardiovascular: RRR, distal pulses 2+ Breast: Implants, breast symmetrical, no tenderness, no palpable nodules or masses, no skin or nipple retraction present, no nipple discharge.  No axillary or supraclavicular lymphadenopathy. Abdomen: NABS, soft, non-tender, non-distended.  Umbilicus without lesions.  No hepatomegaly, splenomegaly or masses palpable. No evidence of hernia  Genitourinary: deferred no concerns/PAP discontinued Extremities: no edema, erythema, or tenderness Neurologic: Grossly intact Psychiatric: mood appropriate, affect full   Assessment: 48 y.o. G0P0000 routine annual exam  Plan: Problem List Items Addressed This Visit   None Visit Diagnoses       Well woman exam without gynecological exam    -  Primary   Relevant Orders   MM 3D SCREENING MAMMOGRAM BILATERAL BREAST     Breast screening       Relevant Orders   MM 3D SCREENING MAMMOGRAM BILATERAL BREAST       1) Mammogram - recommend yearly screening mammogram.  Mammogram Was ordered today  2) STI screening  was offered and declined  3) ASCCP guidelines and rationale discussed.  Patient opts for discontinue secondary to prior hysterectomy screening interval  4) Contraception - the patient is currently using  none.  Hysterectomy  5) Colonoscopy done in 2022 -- Screening recommended starting at age 5 for average risk individuals, age 13 for individuals deemed at increased risk (including African Americans) and recommended to continue until age 31.  For patient age 85-85 individualized approach is recommended.  Gold standard screening is via colonoscopy, Cologuard screening is an acceptable alternative for patient unwilling or unable to undergo colonoscopy.  "Colorectal cancer screening for average?risk adults: 2018 guideline update from the American Cancer  Society"CA: A Cancer Journal for Clinicians: May 30, 2016   6) Routine healthcare maintenance including cholesterol, diabetes screening discussed Ordered today  7) Return in about 1 year (around 05/12/2024) for annual established gyn.   Angelita Kendall, CNM [facility] 05/13/2023 3:45 PM

## 2023-05-13 NOTE — Patient Instructions (Signed)

## 2023-06-07 ENCOUNTER — Ambulatory Visit
Admission: RE | Admit: 2023-06-07 | Discharge: 2023-06-07 | Disposition: A | Discharge status: Home / Self Care | Source: Ambulatory Visit | Attending: Advanced Practice Midwife | Admitting: Advanced Practice Midwife

## 2023-06-07 DIAGNOSIS — Z1231 Encounter for screening mammogram for malignant neoplasm of breast: Secondary | ICD-10-CM | POA: Insufficient documentation

## 2023-06-07 DIAGNOSIS — Z0000 Encounter for general adult medical examination without abnormal findings: Secondary | ICD-10-CM

## 2023-06-07 DIAGNOSIS — Z1239 Encounter for other screening for malignant neoplasm of breast: Secondary | ICD-10-CM

## 2023-06-08 ENCOUNTER — Ambulatory Visit

## 2024-02-25 ENCOUNTER — Ambulatory Visit: Payer: Managed Care, Other (non HMO) | Admitting: Dermatology
